# Patient Record
Sex: Female | Born: 1950 | Race: White | Hispanic: No | Marital: Married | Smoking: Never smoker
Health system: Southern US, Community
[De-identification: ages and names within clinical notes are randomized; demographics above are authoritative.]

## PROBLEM LIST (undated history)

## (undated) DIAGNOSIS — G43909 Migraine, unspecified, not intractable, without status migrainosus: Secondary | ICD-10-CM

## (undated) DIAGNOSIS — G44009 Cluster headache syndrome, unspecified, not intractable: Secondary | ICD-10-CM

## (undated) DIAGNOSIS — Z8619 Personal history of other infectious and parasitic diseases: Secondary | ICD-10-CM

## (undated) DIAGNOSIS — J309 Allergic rhinitis, unspecified: Secondary | ICD-10-CM

## (undated) HISTORY — DX: Migraine, unspecified, not intractable, without status migrainosus: G43.909

## (undated) HISTORY — DX: Personal history of other infectious and parasitic diseases: Z86.19

## (undated) HISTORY — DX: Allergic rhinitis, unspecified: J30.9

## (undated) HISTORY — DX: Cluster headache syndrome, unspecified, not intractable: G44.009

---
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
## 1997-11-15 ENCOUNTER — Ambulatory Visit (HOSPITAL_COMMUNITY): Admission: RE | Admit: 1997-11-15 | Discharge: 1997-11-15 | Payer: Self-pay | Admitting: Obstetrics and Gynecology

## 1998-12-07 ENCOUNTER — Other Ambulatory Visit: Admission: RE | Admit: 1998-12-07 | Discharge: 1998-12-07 | Payer: Self-pay | Admitting: Obstetrics and Gynecology

## 1999-01-05 ENCOUNTER — Ambulatory Visit (HOSPITAL_COMMUNITY): Admission: RE | Admit: 1999-01-05 | Discharge: 1999-01-05 | Payer: Self-pay | Admitting: Obstetrics and Gynecology

## 1999-04-26 ENCOUNTER — Other Ambulatory Visit: Admission: RE | Admit: 1999-04-26 | Discharge: 1999-04-26 | Payer: Self-pay | Admitting: Obstetrics and Gynecology

## 1999-04-26 ENCOUNTER — Encounter (INDEPENDENT_AMBULATORY_CARE_PROVIDER_SITE_OTHER): Payer: Self-pay | Admitting: Specialist

## 2000-03-26 ENCOUNTER — Other Ambulatory Visit: Admission: RE | Admit: 2000-03-26 | Discharge: 2000-03-26 | Payer: Self-pay | Admitting: Obstetrics and Gynecology

## 2000-05-13 ENCOUNTER — Encounter (INDEPENDENT_AMBULATORY_CARE_PROVIDER_SITE_OTHER): Payer: Self-pay | Admitting: Specialist

## 2000-05-13 ENCOUNTER — Other Ambulatory Visit: Admission: RE | Admit: 2000-05-13 | Discharge: 2000-05-13 | Payer: Self-pay | Admitting: Obstetrics and Gynecology

## 2001-08-06 ENCOUNTER — Other Ambulatory Visit: Admission: RE | Admit: 2001-08-06 | Discharge: 2001-08-06 | Payer: Self-pay | Admitting: Obstetrics and Gynecology

## 2001-08-18 ENCOUNTER — Encounter: Payer: Self-pay | Admitting: Obstetrics and Gynecology

## 2001-08-18 ENCOUNTER — Encounter: Admission: RE | Admit: 2001-08-18 | Discharge: 2001-08-18 | Payer: Self-pay | Admitting: Obstetrics and Gynecology

## 2001-10-08 ENCOUNTER — Encounter: Admission: RE | Admit: 2001-10-08 | Discharge: 2001-10-08 | Payer: Self-pay | Admitting: Obstetrics and Gynecology

## 2001-10-08 ENCOUNTER — Encounter: Payer: Self-pay | Admitting: Obstetrics and Gynecology

## 2002-08-10 ENCOUNTER — Other Ambulatory Visit: Admission: RE | Admit: 2002-08-10 | Discharge: 2002-08-10 | Payer: Self-pay | Admitting: Obstetrics and Gynecology

## 2003-09-06 ENCOUNTER — Other Ambulatory Visit: Admission: RE | Admit: 2003-09-06 | Discharge: 2003-09-06 | Payer: Self-pay | Admitting: Obstetrics and Gynecology

## 2004-09-19 ENCOUNTER — Other Ambulatory Visit: Admission: RE | Admit: 2004-09-19 | Discharge: 2004-09-19 | Payer: Self-pay | Admitting: Obstetrics and Gynecology

## 2005-01-11 ENCOUNTER — Ambulatory Visit (HOSPITAL_COMMUNITY): Admission: RE | Admit: 2005-01-11 | Discharge: 2005-01-11 | Payer: Self-pay | Admitting: Gastroenterology

## 2005-01-18 ENCOUNTER — Ambulatory Visit (HOSPITAL_COMMUNITY): Admission: RE | Admit: 2005-01-18 | Discharge: 2005-01-18 | Payer: Self-pay | Admitting: Gastroenterology

## 2005-01-18 ENCOUNTER — Encounter (INDEPENDENT_AMBULATORY_CARE_PROVIDER_SITE_OTHER): Payer: Self-pay | Admitting: Specialist

## 2005-09-25 ENCOUNTER — Other Ambulatory Visit: Admission: RE | Admit: 2005-09-25 | Discharge: 2005-09-25 | Payer: Self-pay | Admitting: Obstetrics and Gynecology

## 2007-01-13 ENCOUNTER — Ambulatory Visit (HOSPITAL_COMMUNITY): Admission: RE | Admit: 2007-01-13 | Discharge: 2007-01-13 | Payer: Self-pay | Admitting: Obstetrics and Gynecology

## 2008-05-13 ENCOUNTER — Other Ambulatory Visit: Admission: RE | Admit: 2008-05-13 | Discharge: 2008-05-13 | Payer: Self-pay | Admitting: Obstetrics and Gynecology

## 2009-05-16 ENCOUNTER — Other Ambulatory Visit: Admission: RE | Admit: 2009-05-16 | Discharge: 2009-05-16 | Payer: Self-pay | Admitting: Obstetrics and Gynecology

## 2010-12-17 LAB — HM MAMMOGRAPHY: HM Mammogram: NORMAL

## 2011-02-02 NOTE — Op Note (Signed)
NAMEAUBREI, Keith           ACCOUNT NO.:  [id number]   MEDICAL RECORD NO.:  [id number]          PATIENT TYPE:  AMB   LOCATION:  ENDO                         FACILITY:  [facility]   PHYSICIAN:  Bernette Redbird, M.D.   DATE OF BIRTH:  [birth year]   DATE OF PROCEDURE:  01/11/2005  DATE OF DISCHARGE:                                 OPERATIVE REPORT   PROCEDURE:  Colonoscopy.   INDICATIONS:  A 60 year old female for colon cancer screening. She has  chronic constipation and a history of nausea with sedation, but no worrisome  risk factors or symptoms. No prior screening exams.   FINDINGS:  Inadequate prep. No gross constricting lesions. The ulcerated  area in proximal colon.   PROCEDURE:  The nature, purpose, risks of the procedure have been discussed  with the patient, who provided written consent. Sedation was fentanyl 100  mcg and Versed 10 mg IV, without arrhythmias or desaturation. The Olympus  adjustable tension pediatric video colonoscope was used for this procedure.  The initial scope clogged due to poor prep and had to be exchanged with  another similar scope, which was advanced to the cecum as identified by  transillumination of the deep right lower quadrant and the absence of  further lumen.   However, the quality of prep on this exam was very poor and an adequate exam  was not able to be obtained. We suctioned out and irrigated a total of  approximately 2 liters of thick, brown fluid but despite this, the large  amount of residual particulate matter prevented adequate suctioning of the  residual stool and there were lakes of pea soup-consistency stool which  could not be suctioned up. Although no gross masses or constricting lesions  were present, I do not feel that small to moderate size lesions could be  excluded adequately by the current examination.   Note that in the proximal colon was an area of what appeared to be mucosal  ulceration about the size of a dime. There  was some slight nodularity  adjacent to it. I do not think this was scope trauma. There appeared to be a  rim of mucosa demarcating the ulcerated area from the normal colonic mucosa.  In attempting to better visualize this area, we shifted the position of the  patient but this basically submerged the area under a lake of thick brown  liquid stool, and we could never revisualize it despite repositioning the  patient, tried to suction etc.. It is conceivable that this lesion  represents some sort of ulcerated flap adenoma.   Pullback was then performed around the colon. The patient tolerated the  procedure well and there were no apparent immediate complications.   IMPRESSION:  1.  Screening colonoscopy with inadequate prep as described above (V7 6.51)  2.  Ulcerated area in the proximal colon of uncertain clinical significance.   PLAN:  We will arrange a repeat colonoscopy about a week from now, with a  more gradual but thorough prep.      RB/MEDQ  D:  01/11/2005  T:  01/11/2005  Job:  [id number]   cc:  Artist Pais, M.D.  [address], [address]  [place]  [place] [address]  Fax: [phone]

## 2011-02-02 NOTE — Op Note (Signed)
Alicia Keith, Alicia Keith           ACCOUNT NO.:  [id number]   MEDICAL RECORD NO.:  [id number]          PATIENT TYPE:  AMB   LOCATION:  ENDO                         FACILITY:  [facility]   PHYSICIAN:  Bernette Redbird, M.D.   DATE OF BIRTH:  August 06, 1951   DATE OF PROCEDURE:  01/18/2005  DATE OF DISCHARGE:                                 OPERATIVE REPORT   PROCEDURE:  Colonoscopy with biopsies.   INDICATION:  A 60 year old for colon cancer screening. Colonoscopy a week  ago was unsuccessful in visualizing the colon due to an inadequate prep but  at that time an ulcerated lesion was noted in the colon.   FINDINGS:  Solitary medium-sized ulceration in the mid colon without any  obvious mass effect.   PROCEDURE:  The nature, purpose and risks of the procedure had been  discussed with the patient who provided written consent. Sedation was  fentanyl 60 mcg, Versed 6 mg and Phenergan 12.5 mg IV without arrhythmias or  desaturation. The Olympus adjustable tension pediatric video colonoscope was  readily advanced to the cecum as identified by clear visualization of the  appendiceal orifice and pullback was then performed.   During pullback in roughly the midpoint of the colon, I encountered again a  1.5 cm fairly deep ulceration with normal-appearing surrounding mucosa and  no mass effect. Multiple biopsies (approximately nine or 10) were obtained  from the margin of this lesion. No other abnormalities were seen in the  colon. Retroflexion in the rectum was not successful due to a small rectal  ampulla. No polyps, cancer, diffuse colitis or vascular ectasia were noted.  The patient tolerated the procedure well and there no apparent  complications. It should be noted that the prep on this occasion was very  adequate.   IMPRESSION:  Solitary medium-sized colonic ulceration of unclear cause.   PLAN:  Await pathology results.      RB/MEDQ  D:  01/18/2005  T:  01/18/2005  Job:  [id number]   cc:    Artist Pais, M.D.  [address], [address]  [place]  [place] [address]  Fax: [phone]

## 2011-09-18 LAB — HM PAP SMEAR: HM Pap smear: NORMAL

## 2012-01-14 ENCOUNTER — Encounter: Payer: Self-pay | Admitting: Internal Medicine

## 2012-01-14 ENCOUNTER — Ambulatory Visit (INDEPENDENT_AMBULATORY_CARE_PROVIDER_SITE_OTHER): Payer: 59 | Admitting: Internal Medicine

## 2012-01-14 VITALS — BP 110/70 | HR 89 | Temp 98.2°F | Ht 61.5 in | Wt 115.0 lb

## 2012-01-14 DIAGNOSIS — Z23 Encounter for immunization: Secondary | ICD-10-CM

## 2012-01-14 DIAGNOSIS — G43909 Migraine, unspecified, not intractable, without status migrainosus: Secondary | ICD-10-CM

## 2012-01-14 DIAGNOSIS — L259 Unspecified contact dermatitis, unspecified cause: Secondary | ICD-10-CM

## 2012-01-14 DIAGNOSIS — Z2911 Encounter for prophylactic immunotherapy for respiratory syncytial virus (RSV): Secondary | ICD-10-CM

## 2012-01-14 DIAGNOSIS — J309 Allergic rhinitis, unspecified: Secondary | ICD-10-CM

## 2012-01-14 DIAGNOSIS — Z0000 Encounter for general adult medical examination without abnormal findings: Secondary | ICD-10-CM

## 2012-01-14 DIAGNOSIS — L309 Dermatitis, unspecified: Secondary | ICD-10-CM

## 2012-01-14 MED ORDER — TRIAMCINOLONE ACETONIDE 0.5 % EX OINT: TOPICAL_OINTMENT | Freq: Two times a day (BID) | CUTANEOUS | Status: AC

## 2012-01-14 MED ORDER — CETIRIZINE HCL 10 MG PO TABS: 10.0000 mg | ORAL_TABLET | Freq: Every day | ORAL | Status: DC

## 2012-01-14 MED ORDER — FLUTICASONE PROPIONATE 50 MCG/ACT NA SUSP: 2.0000 | Freq: Every day | NASAL | Status: DC

## 2012-01-14 NOTE — Progress Notes (Signed)
Subjective:    Patient ID: Alicia Keith, female    DOB: 04-Jun-1951, 61 y.o.   MRN: [id number]  HPI  New pt to me, here to establish care patient is here today for annual physical. Patient feels well and has no complaints. Reports recent labs done at [facility] in past few months including cholesterol and LFTs  Also reviewed chronic medical issues: allergic rhinitis - uncontrolled since 2012 - uses OTC meds prn - no sinus pain or fever, no discolored mucus discharge - ?other treatment options  Migraines, chronic - follows with neuro for same - on current med combo since 2000 - baseline without flares (4 severe HA episodes per month) - no change in nature of headache pattern  complains of itch intermittent on L breast - induced by tick bite? Onset > 1 year ago - temporary improved with OTC steroid cream but recurs every few months - no other skin problems or hx same  Past Medical History  Diagnosis Date  . History of chicken pox   . Headaches, cluster   . Migraine headache   . Allergic rhinitis, cause unspecified    Family History  Problem Relation Age of Onset  . Hyperlipidemia Other   . Hypertension Other    History  Substance Use Topics  . Smoking status: Never Smoker   . Smokeless tobacco: Not on file   Comment: married, retired PT  . Alcohol Use: Yes   Review of Systems Constitutional: Negative for fever or unexpected weight change.  Respiratory: Negative for cough and shortness of breath.   Cardiovascular: Negative for chest pain or palpitations.  Gastrointestinal: Negative for abdominal pain, no bowel changes.  Musculoskeletal: Negative for gait problem or joint swelling.  Skin: Negative for rash.  Neurological: Negative for dizziness or change in headache.  No other specific complaints in a complete review of systems (except as listed in HPI above).     Objective:   Physical Exam BP 110/70  Pulse 89  Temp(Src) 98.2 F (36.8 C) (Oral)  Ht 5' 1.5" (1.562  m)  Wt 115 lb (52.164 kg)  BMI 21.38 kg/m2  SpO2 97% Wt Readings from Last 3 Encounters:  01/14/12 115 lb (52.164 kg)   Constitutional: She is thin, fit and appears well-developed and well-nourished. No distress.  HENT: Head: Normocephalic and atraumatic. No sinus tenderness. Ears: B TMs ok, no erythema or effusion; Nose: clear rhinorrhea with pale turbinates, mild swelling. Mouth/Throat: Oropharynx is clear and moist. No oropharyngeal exudate. clear PND Eyes: Conjunctivae and EOM are normal. Pupils are equal, round, and reactive to light. No scleral icterus.  Neck: Normal range of motion. Neck supple. No JVD present. No thyromegaly present.  Cardiovascular: Normal rate, regular rhythm and normal heart sounds.  No murmur heard. No BLE edema. Pulmonary/Chest: Effort normal and breath sounds normal. No respiratory distress. She has no wheezes.  Abdominal: Soft. Bowel sounds are normal. She exhibits no distension. There is no tenderness. no masses Musculoskeletal: Normal range of motion, no joint effusions. No gross deformities Neurological: She is alert and oriented to person, place, and time. No cranial nerve deficit. Coordination normal.  Skin: mild red dermatitis with slight excoriation over L breast - remaining skin is warm and dry. No rash noted. No erythema.  Psychiatric: She has a normal mood and affect. Her behavior is normal. Judgment and thought content normal.   No results found for this basename: WBC, HGB, HCT, PLT, GLUCOSE, CHOL, TRIG, HDL, LDLDIRECT, LDLCALC, ALT, AST, NA, K, CL,  CREATININE, BUN, CO2, TSH, PSA, INR, GLUF, HGBA1C, MICROALBUR        Assessment & Plan:  CPX/v70.0 - Patient has been counseled on age-appropriate routine health concerns for screening and prevention. These are reviewed and up-to-date. Immunizations are up-to-date or declined. Labs recently done at [facility] - will send for copy of same and will be reviewed. Reports Colo 2007 - ?5 or 7 or 16yr follow  up - will send for records to help determine same, no apparent high risk history for frequent screening on hx   Mild dermatitis, periodic itch - suspect inflammatory reaction to prior insect bite at same location- use stronger steroid as needed - erx done

## 2012-01-14 NOTE — Assessment & Plan Note (Signed)
Chronic symptoms - currently at baseline with 4 severe episodes/month Follows with neuro ([facility]) at [facility] -  Will send for recent labs Med regimen reviewed, no changes recommended

## 2012-01-14 NOTE — Patient Instructions (Addendum)
It was good to see you today. we will send to your prior provider(s) for "release of records" as discussed today - labs from Dr Neale Burly and colonoscopy from Dr Matthias Hughs Usually colonoscopy is recommended every 10 years unless there are risk factors prompting more frequent screening - I will review Dr Buccini's notes and let you know when you are next due for colonoscopy screening Health Maintenance reviewed - Shingles vaccine today -all other recommended immunizations and age-appropriate screenings are up-to-date.  Add flonase spray daily to daily Zyrtec for allergy symptoms  Use Kenalog ointment for your skin itch as discussed Your prescription(s) have been submitted to your local pharmacy. Please take as directed and contact our office if you believe you are having problem(s) with the medication(s). Please schedule followup in 1 year for medical physical and labs, call sooner if problems.

## 2012-01-14 NOTE — Assessment & Plan Note (Signed)
Takes OTC meds as needed Will add flonase to supplement her OTC meds

## 2012-01-15 ENCOUNTER — Telehealth: Payer: Self-pay | Admitting: Internal Medicine

## 2012-01-15 NOTE — Telephone Encounter (Signed)
Received copies from [facility] ,on 01/15/12. Forwarded 15 pages to [employer] ,for review.

## 2012-01-25 ENCOUNTER — Telehealth: Payer: Self-pay | Admitting: Medical Oncology

## 2012-01-25 ENCOUNTER — Other Ambulatory Visit: Payer: Self-pay | Admitting: Medical Oncology

## 2012-01-25 NOTE — Telephone Encounter (Signed)
Received fax request for refill from [facility].  I called [facility] and pt called in for refill today , but rx expired. I called pt home phone and left message to return call.

## 2012-01-25 NOTE — Telephone Encounter (Signed)
Pt returned my call and told me to ignore refill request. Done.

## 2012-06-19 ENCOUNTER — Encounter: Payer: Self-pay | Admitting: Obstetrics and Gynecology

## 2012-06-19 NOTE — Progress Notes (Signed)
Quick Note:  Please send "Dense breast" letter to patient and document in chart when letter is sent. ______ 

## 2012-08-06 ENCOUNTER — Other Ambulatory Visit: Payer: Self-pay | Admitting: Internal Medicine

## 2013-01-12 ENCOUNTER — Other Ambulatory Visit (HOSPITAL_COMMUNITY): Payer: Self-pay | Admitting: Orthopedic Surgery

## 2013-01-12 DIAGNOSIS — M25562 Pain in left knee: Secondary | ICD-10-CM

## 2013-01-14 ENCOUNTER — Ambulatory Visit (HOSPITAL_COMMUNITY): Admission: RE | Admit: 2013-01-14 | Payer: 59 | Source: Ambulatory Visit

## 2013-01-14 ENCOUNTER — Ambulatory Visit (HOSPITAL_COMMUNITY)
Admission: RE | Admit: 2013-01-14 | Discharge: 2013-01-14 | Disposition: A | Discharge status: Home / Self Care | Payer: 59 | Source: Ambulatory Visit | Attending: Orthopedic Surgery | Admitting: Orthopedic Surgery

## 2013-01-14 DIAGNOSIS — M7120 Synovial cyst of popliteal space [Baker], unspecified knee: Secondary | ICD-10-CM | POA: Insufficient documentation

## 2013-01-14 DIAGNOSIS — M25569 Pain in unspecified knee: Secondary | ICD-10-CM | POA: Insufficient documentation

## 2013-02-02 ENCOUNTER — Other Ambulatory Visit: Payer: Self-pay | Admitting: Internal Medicine

## 2013-04-27 ENCOUNTER — Other Ambulatory Visit: Payer: Self-pay | Admitting: Internal Medicine

## 2013-08-06 ENCOUNTER — Other Ambulatory Visit: Payer: Self-pay | Admitting: Internal Medicine

## 2013-08-28 ENCOUNTER — Other Ambulatory Visit: Payer: Self-pay | Admitting: *Deleted

## 2013-08-28 MED ORDER — FLUTICASONE PROPIONATE 50 MCG/ACT NA SUSP: NASAL | Status: DC

## 2015-01-19 ENCOUNTER — Other Ambulatory Visit (HOSPITAL_COMMUNITY): Payer: Self-pay | Admitting: Gastroenterology

## 2015-02-21 ENCOUNTER — Encounter: Payer: Self-pay | Admitting: Internal Medicine

## 2015-03-07 ENCOUNTER — Other Ambulatory Visit: Payer: Self-pay | Admitting: Oncology

## 2015-03-07 MED ORDER — MOMETASONE FUROATE 0.1 % EX CREA: TOPICAL_CREAM | CUTANEOUS | Status: DC

## 2015-06-08 ENCOUNTER — Other Ambulatory Visit: Payer: Self-pay | Admitting: Oncology

## 2015-06-08 MED ORDER — LEVOFLOXACIN 250 MG PO TABS: 250.0000 mg | ORAL_TABLET | Freq: Every day | ORAL | Status: DC

## 2015-06-08 MED ORDER — MOMETASONE FUROATE 0.1 % EX CREA: TOPICAL_CREAM | CUTANEOUS | Status: DC

## 2015-10-12 DIAGNOSIS — Z01419 Encounter for gynecological examination (general) (routine) without abnormal findings: Secondary | ICD-10-CM | POA: Diagnosis not present

## 2015-10-12 DIAGNOSIS — N898 Other specified noninflammatory disorders of vagina: Secondary | ICD-10-CM | POA: Diagnosis not present

## 2015-10-12 DIAGNOSIS — M810 Age-related osteoporosis without current pathological fracture: Secondary | ICD-10-CM | POA: Diagnosis not present

## 2015-10-12 DIAGNOSIS — Z6822 Body mass index (BMI) 22.0-22.9, adult: Secondary | ICD-10-CM | POA: Diagnosis not present

## 2015-11-14 DIAGNOSIS — G43019 Migraine without aura, intractable, without status migrainosus: Secondary | ICD-10-CM | POA: Diagnosis not present

## 2015-11-14 DIAGNOSIS — R51 Headache: Secondary | ICD-10-CM | POA: Diagnosis not present

## 2015-11-14 DIAGNOSIS — Z79899 Other long term (current) drug therapy: Secondary | ICD-10-CM | POA: Diagnosis not present

## 2015-11-14 DIAGNOSIS — G43719 Chronic migraine without aura, intractable, without status migrainosus: Secondary | ICD-10-CM | POA: Diagnosis not present

## 2015-11-14 LAB — BASIC METABOLIC PANEL
BUN: 26 mg/dL — AB (ref 4–21)
Creatinine: 0.9 mg/dL (ref <=1.1)
GLUCOSE: 95 mg/dL
Potassium: 4.8 mmol/L (ref 3.4–5.3)
SODIUM: 139 mmol/L (ref 137–147)

## 2015-11-14 LAB — LIPID PANEL
Cholesterol: 255 mg/dL — AB (ref 0–200)
HDL: 91 mg/dL — AB (ref 35–70)
LDL CALC: 151 mg/dL
Triglycerides: 65 mg/dL (ref 40–160)

## 2015-11-14 LAB — HEPATIC FUNCTION PANEL
ALT: 20 U/L (ref 7–35)
AST: 24 U/L (ref 13–35)

## 2015-11-25 DIAGNOSIS — Z1231 Encounter for screening mammogram for malignant neoplasm of breast: Secondary | ICD-10-CM | POA: Diagnosis not present

## 2015-12-09 DIAGNOSIS — Z0100 Encounter for examination of eyes and vision without abnormal findings: Secondary | ICD-10-CM | POA: Diagnosis not present

## 2016-04-06 DIAGNOSIS — J069 Acute upper respiratory infection, unspecified: Secondary | ICD-10-CM | POA: Diagnosis not present

## 2016-04-09 ENCOUNTER — Telehealth: Payer: Self-pay | Admitting: Internal Medicine

## 2016-04-09 NOTE — Telephone Encounter (Signed)
Ok to  Accept  as new patient last Ov dr Felicity Coyer was 2013

## 2016-04-09 NOTE — Telephone Encounter (Signed)
Pt would like to know if you will accept her as a pt?  Pt's husband called to ask and is aware a note to be generated. Please advise if ok to schedule.

## 2016-04-23 NOTE — Progress Notes (Signed)
Pre visit review using our clinic review tool, if applicable. No additional management support is needed unless otherwise documented below in the visit note.  Chief Complaint  Patient presents with  . Establish Care     fu rx pna abnormal cxray    HPI: Patient  Alicia Keith  65 y.o. comes in today for New patient   visit  gegnerally well followed by Dr Neale Burly for  Migraines now quiescnet mostly  On lexapro , topamax and  Baclofen prn relpax a  seasonal allergy rx [facility] and otc as needed  Who  Had 2-3 weeks of couging and malaise on trip to [place] and  [place] to [facility] given z pack pred for 5 days tessalon perles and atrovent nasal    X ray showed poss opacity at right mid lung field ,  alsohyperinflation  cw copd .  Digital view was reviewed by dr Chestine Spore radiologist and felt CW PNA   And poss artifact vs nodule along rib.   Since her rx she has improved over 80 % still some cough . No fever  Appetite has been good . Needed fu as her PCP dr Felicity Coyer is no longer seeing patients.   Health Maintenance  Topic Date Due  . DEXA SCAN  05/18/2014  . MAMMOGRAM  06/18/2014  . TETANUS/TDAP  09/18/2015  . INFLUENZA VACCINE  04/17/2016  . Hepatitis C Screening  04/23/2017 (Originally 1950-11-02)  . PNA vac Low Risk Adult (2 of 2 - PPSV23) 04/24/2017  . COLONOSCOPY  01/18/2025  . ZOSTAVAX  Completed   Health Maintenance Review LIFESTYLE:  Exercise:   Yes walk  Tobacco/ETS: no but ets growing up Alcohol:   2 x per week Sugar beverages:n Sleep:8 hours Drug use: no HH of 2  G1P1 Last pap 2015 mammo 17 Td 2007? coloon 2016    ROS:  GEN/ HEENT: No fever, significant weight changes sweats headaches vision problems hearing changes, CV/ PULM; No chest pain shortness of breath , syncope,edema  change in exercise tolerance. GI /GU: No adominal pain, vomiting, change in bowel habits. No blood in the stool. No significant GU symptoms. SKIN/HEME: ,no acute skin rashes suspicious lesions or  bleeding. No lymphadenopathy, nodules, masses.  NEURO/ PSYCH:  No neurologic signs such as weakness numbness. No depression anxiety. IMM/ Allergy: No unusual infections.  Allergy .   REST of 12 system review negative except as per HPI   Past Medical History:  Diagnosis Date  . Allergic rhinitis, cause unspecified   . Headaches, cluster   . History of chicken pox   . Migraine headache     Past Surgical History:  Procedure Laterality Date  . TONSILLECTOMY  1955    Family History  Problem Relation Age of Onset  . Hyperlipidemia Mother   . Renal cancer Mother   . Hypertension Father   . Stroke Father     Social History   Social History  . Marital status: Married    Spouse name: N/A  . Number of children: N/A  . Years of education: N/A   Occupational History  . Retired    Social History Main Topics  . Smoking status: Never Smoker  . Smokeless tobacco: Never Used     Comment: married, retired PT  . Alcohol use Yes     Comment: "Couple glasses of wine"  . Drug use: No  . Sexual activity: Not Asked   Other Topics Concern  . None   Social History Narrative  8 hours of sleep per night   Lives with Dr. 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 baclofen (LIORESAL) 10 MG tablet Take 10 mg by mouth daily as needed.    Marland Kitchen escitalopram (LEXAPRO) 10 MG tablet Take 10 mg by mouth daily.    . fluticasone (FLONASE) 50 MCG/ACT nasal spray instill 2 sprays into each nostril once daily 16 g 0  . levofloxacin (LEVAQUIN) 250 MG tablet Take 1 tablet (250 mg total) by mouth daily. 14 tablet 1  . mometasone (ELOCON) 0.1 % cream Apply to affected area daily 15 g 4  . topiramate (TOPAMAX) 200 MG tablet Take 200 mg by mouth daily.    . cetirizine (ZYRTEC) 10 MG tablet Take 1 tablet (10 mg total) by mouth daily. 30 tablet 11  . eletriptan (RELPAX) 20 MG tablet One tablet by mouth at  onset of headache. May repeat in 2 hours if headache persists or recurs. may repeat in 2 hours if necessary     No facility-administered medications prior to visit.      EXAM:  BP 92/66   Pulse 80   Temp 97.6 F (36.4 C) (Oral)   Ht 5\' 1"  (1.549 m)   Wt 116 lb 6.4 oz (52.8 kg)   SpO2 97%   BMI 21.99 kg/m   Body mass index is 21.99 kg/m.  Physical Exam: Vital signs reviewed ocass dry upper airway sounding irritative cough mild hoarse no stridor ZOX:WRUE is a well-developed well-nourished alert cooperative    who appearsr stated age in no acute distress.  HEENT: normocephalic atraumatic , Eyes: PERRL EOM's full, conjunctiva clear, Nares: paten,t no deformity discharge or tenderness., min congestion and no face pain  Ears: no deformity EAC's clear TMs with normal landmarks. Mouth: clear OP, no lesions, edema.  Moist mucous membranes. Dentition in adequate repair. NECK: supple without masses, thyromegaly or bruits. CHEST/PULM:  Clear to auscultation and percussion breath sounds equal no wheeze , rales or rhonchi. No chest wall deformities or tenderness. CV: PMI is nondisplaced, S1 S2 no gallops, murmurs, rubs. Peripheral pulses are full without delay. .  ABDOMEN: Bowel sounds normal nontender  No guard or rebound, no hepato splenomegal  Extremtities:  No clubbing cyanosis or edema, no acute joint swelling or redness no focal atrophy NEURO:  Oriented x3, cranial nerves 3-12 appear to be intact, no obvious focal weakness,gait within normal limits  SKIN: No acute rashes normal turgor, color, no bruising or petechiae. PSYCH: Oriented, good eye contact, no obvious depression anxiety, cognition and judgment appear normal. LN: no cervical  adenopathy  No results found for: WBC, HGB, HCT, PLT, GLUCOSE, CHOL, TRIG, HDL, LDLDIRECT, LDLCALC, ALT, AST, NA, K, CL, CREATININE, BUN, CO2, TSH, PSA, INR, GLUF, HGBA1C, MICROALBUR  ASSESSMENT AND PLAN:  Discussed the following assessment and  plan:  Pneumonia involving right lung, unspecified part of lung - improved lcinically  - Plan: DG Chest 2 View  Abnormal chest x-ray - poss copd  right nodule vs artifact  plan fu x ray in 1-2 weeks plan pfts when well  Need for vaccination with 13-polyvalent pneumococcal conjugate vaccine - Plan: Pneumococcal conjugate vaccine 13-valent  Other seasonal allergic rhinitis - no hx of asthma HAs  Stable under specialty care.  Patient Care Team: Madelin Headings, MD as PCP - General (Internal Medicine) Santiago Glad, MD (Neurology) Silverio Lay,  MD (Obstetrics and Gynecology) Patient Instructions  Get x ray end of week     For repeat  At some point plan   PFTS to look at lung function .   Plan fu depending on results of  X ray and how you  Are doing .  Or  1 month or so .  If better can send in on my chart and we can order pfts  And then [employer]    Rosalind Guido K. Abdulla Pooley M.D.

## 2016-04-24 ENCOUNTER — Ambulatory Visit (INDEPENDENT_AMBULATORY_CARE_PROVIDER_SITE_OTHER): Payer: 59 | Admitting: Internal Medicine

## 2016-04-24 ENCOUNTER — Encounter: Payer: Self-pay | Admitting: Internal Medicine

## 2016-04-24 ENCOUNTER — Ambulatory Visit: Payer: 59 | Admitting: Internal Medicine

## 2016-04-24 VITALS — BP 92/66 | HR 80 | Temp 97.6°F | Ht 61.0 in | Wt 116.4 lb

## 2016-04-24 DIAGNOSIS — J302 Other seasonal allergic rhinitis: Secondary | ICD-10-CM | POA: Diagnosis not present

## 2016-04-24 DIAGNOSIS — R938 Abnormal findings on diagnostic imaging of other specified body structures: Secondary | ICD-10-CM | POA: Diagnosis not present

## 2016-04-24 DIAGNOSIS — R9389 Abnormal findings on diagnostic imaging of other specified body structures: Secondary | ICD-10-CM

## 2016-04-24 DIAGNOSIS — Z23 Encounter for immunization: Secondary | ICD-10-CM | POA: Diagnosis not present

## 2016-04-24 DIAGNOSIS — J189 Pneumonia, unspecified organism: Secondary | ICD-10-CM

## 2016-04-24 NOTE — Patient Instructions (Addendum)
Get x ray end of week     For repeat  At some point plan   PFTS to look at lung function .   Plan fu depending on results of  X ray and how you  Are doing .  Or  1 month or so .  If better can send in on my chart and we can order pfts  And then ROV

## 2016-04-27 ENCOUNTER — Ambulatory Visit (INDEPENDENT_AMBULATORY_CARE_PROVIDER_SITE_OTHER)
Admission: RE | Admit: 2016-04-27 | Discharge: 2016-04-27 | Disposition: A | Discharge status: Home / Self Care | Payer: 59 | Source: Ambulatory Visit | Attending: Internal Medicine | Admitting: Internal Medicine

## 2016-04-27 DIAGNOSIS — J189 Pneumonia, unspecified organism: Secondary | ICD-10-CM

## 2016-04-27 NOTE — Telephone Encounter (Signed)
Pt was scheduled

## 2016-05-03 ENCOUNTER — Encounter: Payer: Self-pay | Admitting: Family Medicine

## 2016-05-04 DIAGNOSIS — G43719 Chronic migraine without aura, intractable, without status migrainosus: Secondary | ICD-10-CM | POA: Diagnosis not present

## 2016-05-04 DIAGNOSIS — G43019 Migraine without aura, intractable, without status migrainosus: Secondary | ICD-10-CM | POA: Diagnosis not present

## 2016-05-10 ENCOUNTER — Other Ambulatory Visit: Payer: Self-pay

## 2016-06-05 ENCOUNTER — Telehealth: Payer: Self-pay | Admitting: Internal Medicine

## 2016-06-05 DIAGNOSIS — Z0100 Encounter for examination of eyes and vision without abnormal findings: Secondary | ICD-10-CM | POA: Diagnosis not present

## 2016-06-05 NOTE — Telephone Encounter (Signed)
Pt would like a call back.  Refused to elaborate.

## 2016-06-05 NOTE — Telephone Encounter (Signed)
OV to discuss     Need  copy of OV from ophthalmologist to review  If recurrent  Sx seek emergent care

## 2016-06-05 NOTE — Telephone Encounter (Signed)
Spoke to Alicia Keith.  Scheduled for 06/06/16 @ 12:30 PM.  She will stop by to pick up office note and bring with her.  Asked her to arrive at 12:15 PM.

## 2016-06-05 NOTE — Telephone Encounter (Signed)
Spoke to the pt.  She is experiencing vision in her rt eye.  She [facility] and he/she could not find anything wrong.  Was told it may have been a TIA.  Pt would like to know how to proceed.  She believes if TIA did occur than it was a month ago.  Please advise.

## 2016-06-06 ENCOUNTER — Encounter: Payer: Self-pay | Admitting: Internal Medicine

## 2016-06-06 ENCOUNTER — Ambulatory Visit (HOSPITAL_COMMUNITY)
Admission: RE | Admit: 2016-06-06 | Discharge: 2016-06-06 | Disposition: A | Discharge status: Home / Self Care | Payer: 59 | Source: Ambulatory Visit | Attending: Internal Medicine | Admitting: Internal Medicine

## 2016-06-06 ENCOUNTER — Ambulatory Visit (INDEPENDENT_AMBULATORY_CARE_PROVIDER_SITE_OTHER): Payer: 59 | Admitting: Internal Medicine

## 2016-06-06 ENCOUNTER — Other Ambulatory Visit: Payer: Self-pay | Admitting: Internal Medicine

## 2016-06-06 VITALS — BP 90/66 | Temp 98.5°F | Ht 61.0 in | Wt 115.1 lb

## 2016-06-06 DIAGNOSIS — H53121 Transient visual loss, right eye: Secondary | ICD-10-CM | POA: Diagnosis not present

## 2016-06-06 DIAGNOSIS — I700 Atherosclerosis of aorta: Secondary | ICD-10-CM

## 2016-06-06 DIAGNOSIS — I6523 Occlusion and stenosis of bilateral carotid arteries: Secondary | ICD-10-CM | POA: Insufficient documentation

## 2016-06-06 DIAGNOSIS — Z23 Encounter for immunization: Secondary | ICD-10-CM

## 2016-06-06 LAB — CBC WITH DIFFERENTIAL/PLATELET
Basophils Relative: 1.2 % (ref 0.0–3.0)
EOS PCT: 2.5 % (ref 0.0–5.0)
HCT: 37.2 % (ref 36.0–46.0)
HEMOGLOBIN: 12.5 g/dL (ref 12.0–15.0)
Lymphocytes Relative: 27.2 % (ref 12.0–46.0)
MCHC: 33.7 g/dL (ref 30.0–36.0)
MCV: 84.4 fl (ref 78.0–100.0)
Monocytes Relative: 8.1 % (ref 3.0–12.0)
Neutrophils Relative %: 61 % (ref 43.0–77.0)
Platelets: 108 10*3/uL — ABNORMAL LOW (ref 150.0–400.0)
RBC: 4.41 Mil/uL (ref 3.87–5.11)
RDW: 14.4 % (ref 11.5–15.5)

## 2016-06-06 LAB — C-REACTIVE PROTEIN: CRP: 0.2 mg/dL — AB (ref 0.5–20.0)

## 2016-06-06 LAB — BASIC METABOLIC PANEL
BUN: 25 mg/dL — AB (ref 6–23)
CHLORIDE: 103 meq/L (ref 96–112)
CO2: 31 mEq/L (ref 19–32)
Calcium: 8.9 mg/dL (ref 8.4–10.5)
Creatinine, Ser: 0.88 mg/dL (ref 0.40–1.20)
GFR: 68.11 mL/min (ref >60.00)
Glucose, Bld: 79 mg/dL (ref 70–99)
POTASSIUM: 4.4 meq/L (ref 3.5–5.1)
SODIUM: 139 meq/L (ref 135–145)

## 2016-06-06 LAB — SEDIMENTATION RATE: SED RATE: 16 mm/h (ref 0–30)

## 2016-06-06 NOTE — Progress Notes (Signed)
Pre visit review using our clinic review tool, if applicable. No additional management support is needed unless otherwise documented below in the visit note.  Chief Complaint  Patient presents with  . Loss of Vision    Rt eye    HPI: Alicia Keith 65 y.o.  sda appt  After seeing  opthal  On  Sept  19th 2017    And told could have had a tia ?  The scenario is that she was taking a walk in the woods in about 15-20 minutes into it noted some black spots in her right eye vision but eventually had a transient loss of the inferior aspect of your visual field only on the right. There was no other scotoma left eye was fine this lasted about 30 seconds. She had no associated headache vomiting arm leg symptoms cardiovascular symptoms. She walked back to her vehicle without difficulty and went home. She did take some aspirin on the advice of her husband. And got an appointment with her on a Dr. Nelle DonHollander. It took her a month to get in to be seen. She was seen yesterday was told she had a normal visual exam and probably had a TIA. She contacted us for further advice evaluation. Since this event she has had no other visual changes or neurologic symptoms. The only thing she can think of is when she got back to the house after her walk her right arm was shaking but not weak or numb. She's not had this problem before but has had intermittent frequent migraines. [facility]. She has a remote history of having had bilateral visual changes or ophthalmic migraine with a mild headache a while back. The symptoms were bilateral. She has no chest pain she thinks her reading is back to normal after recovering from her pneumonia. She denies any palpitations racing heart syncope. She takes Maxalt as needed for her migraines. ROS: See pertinent positives and negatives per HPI.  Past Medical History:  Diagnosis Date  . Allergic rhinitis, cause  unspecified   . Headaches, cluster   . History of chicken pox   . Migraine headache     Family History  Problem Relation Age of Onset  . Hyperlipidemia Mother   . Renal cancer Mother   . Hypertension Father   . Stroke Father     Social History   Social History  . Marital status: Married    Spouse name: N/A  . Number of children: N/A  . Years of education: N/A   Occupational History  . Retired    Social History Main Topics  . Smoking status: Never Smoker  . Smokeless tobacco: Never Used     Comment: married, retired PT  . Alcohol use Yes     Comment: "Couple glasses of wine"  . Drug use: No  . Sexual activity: Not Asked   Other Topics Concern  . None   Social History Narrative   8 hours of sleep per night   Lives with Dr. 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 baclofen (LIORESAL) 10 MG tablet Take 10 mg by mouth daily as needed.    Marland Kitchen. escitalopram (LEXAPRO) 10 MG tablet Take 10 mg by mouth daily.    . Estradiol (YUVAFEM) 10 MCG TABS vaginal tablet Place  vaginally. Using twice weekly    . fluticasone (FLONASE) 50 MCG/ACT nasal spray instill 2 sprays into each nostril once daily 16 g 0  . ibandronate (BONIVA) 150 MG tablet Take 150 mg by mouth every 30 (thirty) days. Take in the morning with a full glass of water, on an empty stomach, and do not take anything else by mouth or lie down for the next 30 min.    . mometasone (ELOCON) 0.1 % cream Apply to affected area daily 15 g 4  . rizatriptan (MAXALT) 10 MG tablet Take 10 mg by mouth as needed for migraine. May repeat in 2 hours if needed    . topiramate (TOPAMAX) 200 MG tablet Take 200 mg by mouth daily.    Marland Kitchen levofloxacin (LEVAQUIN) 250 MG tablet Take 1 tablet (250 mg total) by mouth daily. 14 tablet 1   No facility-administered medications prior to visit.      EXAM:  BP 90/66 (BP Location: Right Arm, Patient  Position: Sitting, Cuff Size: Normal)   Temp 98.5 F (36.9 C) (Oral)   Ht 5\' 1"  (1.549 m)   Wt 115 lb 1.6 oz (52.2 kg)   BMI 21.75 kg/m   Body mass index is 21.75 kg/m.  GENERAL: vitals reviewed and listed above, alert, oriented, appears well hydrated and in no acute distress HEENT: atraumatic, conjunctiva  clear, no obvious abnormalities on inspection of external nose and earseoms are normal  OP : no lesion edema or exudate tongue midline she has an occasional throat clearing cough. NECK: no obvious masses on inspection palpation no bruits are heard. LUNGS: clear to auscultation bilaterally, no wheezes, rales or rhonchi, good air movement CV: HRRR, no clubbing cyanosis or  peripheral edema nl cap refill  MS: moves all extremities without noticeable focal  Abnormality Abdomen soft no masses no bruits are heard. NEURO: oriented x 3 CN 3-12 appear intact. No focal muscle weakness or atrophy. DTRs symmetrical. Gait WNL.  Grossly non focal. No tremor or abnormal movement. PSYCH: pleasant and cooperative, no obvious depression or anxiety Lab Results  Component Value Date   WBC ----- 06/06/2016   HGB 12.5 06/06/2016   HCT 37.2 06/06/2016   PLT 108.0 (L) 06/06/2016   GLUCOSE 79 06/06/2016   CHOL 255 (A) 11/14/2015   TRIG 65 11/14/2015   HDL 91 (A) 11/14/2015   LDLCALC 151 11/14/2015   ALT 20 11/14/2015   AST 24 11/14/2015   NA 139 06/06/2016   K 4.4 06/06/2016   CL 103 06/06/2016   CREATININE 0.88 06/06/2016   BUN 25 (H) 06/06/2016   CO2 31 06/06/2016  FINDINGS:last x ray  The lungs are hyperinflated. The interstitial markings are coarse. There is no alveolar infiltrate or parenchymal masses. There is no pleural effusion or pneumothorax. The heart and pulmonary vascularity are normal. The mediastinum is normal in width. There is calcification in the wall of the aortic arch. The bony thorax is unremarkable.  IMPRESSION: COPD-reactive airway disease. No evidence of pneumonia,  CHF, nor other acute cardiopulmonary abnormality.  Aortic atherosclerosis.   Electronically Signed   By: David  [place] M.D.   On: 04/27/2016 14:08   ASSESSMENT AND PLAN:  Discussed the following assessment and plan:  Transient visual loss of right eye - inferior x 30 seconds  - Plan: EKG 12-Lead, CBC with Differential/Platelet, C-reactive protein, Sedimentation rate, Basic metabolic panel, CANCELED: US Carotid Bilateral  Atherosclerosis of aorta (HCC) - Plan: EKG 12-Lead, CANCELED: US Carotid Bilateral  Need for prophylactic  vaccination and inoculation against influenza - Plan: Flu vaccine HIGH DOSE PF (Fluzone High dose) This episode was 30 seconds a month ago with no residual and now normal eye exam. Lab evaluation today EKG normal sinus rhythm with RR prime in V1 probably normal cardiovascular exam appears to be normal. Begin with carotid duplex Dopplers would like to get her neurologist involved also. Incidental finding on her chest x-ray does show some atherosclerosis in the aorta and she should probably benefit from statin medication based on this matter what her Doppler showed. -Patient advised to return or notify health care team  if symptoms worsen ,persist or new concerns arise.  Patient Instructions  Will proceed with further evaluation  Vascular evaluation. Neur.  I usually have neurology also  Involved .  We may have dr Neale Burly opine  In regard to your sx and eval  In the interim please take asa every day .   You should be contacted about getting  Carotid  Artery evaluation.  Blood tests today .   Also incidental finding on you past x ray shoed some calcification in the wall of the aorta . Which may portend  risk .  We  Should considider adding a cholesterol medicine  But ok to do evaluation first.   If any recurrent sx  Seek emergent care right away.     Neta Mends. Nolita Kutter M.D.  Carotid Dopplers were normal with good flow. Contacted Dr. Onnie Boer office they  can see her as a follow-up next week to  Help decide on  she needs further neurologic referral evaluation.

## 2016-06-06 NOTE — Patient Instructions (Addendum)
Will proceed with further evaluation  Vascular evaluation. Neur.  I usually have neurology also  Involved .  We may have dr Neale BurlyFreeman opine  In regard to your sx and eval  In the interim please take asa every day .   You should be contacted about getting  Carotid  Artery evaluation.  Blood tests today .   Also incidental finding on you past x ray shoed some calcification in the wall of the aorta . Which may portend  risk .  We  Should considider adding a cholesterol medicine  But ok to do evaluation first.   If any recurrent sx  Seek emergent care right away.

## 2016-06-08 ENCOUNTER — Telehealth: Payer: Self-pay | Admitting: Internal Medicine

## 2016-06-08 NOTE — Telephone Encounter (Signed)
Spoke to the pt.  See result note. 

## 2016-06-08 NOTE — Telephone Encounter (Signed)
Pt is returning misty call °

## 2016-06-14 DIAGNOSIS — G43019 Migraine without aura, intractable, without status migrainosus: Secondary | ICD-10-CM | POA: Diagnosis not present

## 2016-06-14 DIAGNOSIS — G43719 Chronic migraine without aura, intractable, without status migrainosus: Secondary | ICD-10-CM | POA: Diagnosis not present

## 2016-06-15 ENCOUNTER — Other Ambulatory Visit: Payer: Self-pay | Admitting: Specialist

## 2016-06-15 DIAGNOSIS — R519 Headache, unspecified: Secondary | ICD-10-CM

## 2016-06-15 DIAGNOSIS — R51 Headache: Principal | ICD-10-CM

## 2016-06-26 ENCOUNTER — Ambulatory Visit
Admission: RE | Admit: 2016-06-26 | Discharge: 2016-06-26 | Disposition: A | Discharge status: Home / Self Care | Payer: 59 | Source: Ambulatory Visit | Attending: Specialist | Admitting: Specialist

## 2016-06-26 DIAGNOSIS — R51 Headache: Principal | ICD-10-CM

## 2016-06-26 DIAGNOSIS — H53121 Transient visual loss, right eye: Secondary | ICD-10-CM | POA: Diagnosis not present

## 2016-06-26 DIAGNOSIS — R519 Headache, unspecified: Secondary | ICD-10-CM

## 2016-10-31 DIAGNOSIS — G43019 Migraine without aura, intractable, without status migrainosus: Secondary | ICD-10-CM | POA: Diagnosis not present

## 2016-10-31 DIAGNOSIS — G43719 Chronic migraine without aura, intractable, without status migrainosus: Secondary | ICD-10-CM | POA: Diagnosis not present

## 2016-11-14 DIAGNOSIS — Z124 Encounter for screening for malignant neoplasm of cervix: Secondary | ICD-10-CM | POA: Diagnosis not present

## 2016-11-14 DIAGNOSIS — Z6822 Body mass index (BMI) 22.0-22.9, adult: Secondary | ICD-10-CM | POA: Diagnosis not present

## 2016-11-14 DIAGNOSIS — Z1231 Encounter for screening mammogram for malignant neoplasm of breast: Secondary | ICD-10-CM | POA: Diagnosis not present

## 2016-11-14 DIAGNOSIS — N898 Other specified noninflammatory disorders of vagina: Secondary | ICD-10-CM | POA: Diagnosis not present

## 2016-11-14 DIAGNOSIS — M810 Age-related osteoporosis without current pathological fracture: Secondary | ICD-10-CM | POA: Diagnosis not present

## 2016-11-14 DIAGNOSIS — Z01419 Encounter for gynecological examination (general) (routine) without abnormal findings: Secondary | ICD-10-CM | POA: Diagnosis not present

## 2016-11-26 DIAGNOSIS — Z1231 Encounter for screening mammogram for malignant neoplasm of breast: Secondary | ICD-10-CM | POA: Diagnosis not present

## 2016-11-26 LAB — HM MAMMOGRAPHY

## 2016-11-29 ENCOUNTER — Ambulatory Visit (INDEPENDENT_AMBULATORY_CARE_PROVIDER_SITE_OTHER): Payer: 59 | Admitting: Neurology

## 2016-11-29 ENCOUNTER — Encounter: Payer: Self-pay | Admitting: Neurology

## 2016-11-29 VITALS — BP 100/66 | HR 86 | Ht 60.5 in | Wt 118.5 lb

## 2016-11-29 DIAGNOSIS — G43711 Chronic migraine without aura, intractable, with status migrainosus: Secondary | ICD-10-CM

## 2016-11-29 MED ORDER — TOPIRAMATE 100 MG PO TABS: 100.0000 mg | ORAL_TABLET | Freq: Every day | ORAL | 3 refills | Status: DC

## 2016-11-29 MED ORDER — SERTRALINE HCL 25 MG PO TABS: 25.0000 mg | ORAL_TABLET | Freq: Every day | ORAL | 5 refills | Status: DC

## 2016-11-29 MED ORDER — ZONISAMIDE 25 MG PO CAPS: 25.0000 mg | ORAL_CAPSULE | Freq: Two times a day (BID) | ORAL | 0 refills | Status: DC

## 2016-11-29 NOTE — Patient Instructions (Signed)
Zonisamide capsules What is this medicine? ZONISAMIDE (zoe NIS a mide) is used to control partial seizures in adults with epilepsy. This medicine may be used for other purposes; ask your health care provider or pharmacist if you have questions. COMMON BRAND NAME(S): Zonegran What should I tell my health care provider before I take this medicine? They need to know if you have any of these conditions: -dehydrated -diarrhea -history of metabolic acidosis (too much acid in your blood) -ketogenic diet -kidney disease -liver disease -lung disease -osteoporosis -suicidal thoughts, plans, or attempt; a previous suicide attempt by you or a family member -an unusual or allergic reaction to zonisamide, sulfa drugs, other medicines, foods, dyes, or preservatives -pregnant or trying to get pregnant -breast-feeding How should I use this medicine? Take this medicine by mouth with a glass of water. Follow the directions on the prescription label. Swallow whole. Do not break open the capsule. This medicine may be taken with or without food. Take your doses at regular intervals. Do not take your medicine more often than directed. Do not stop taking this medicine unless instructed by your doctor or health care professional. A special MedGuide will be given to you by the pharmacist with each prescription and refill. Be sure to read this information carefully each time. Talk to your pediatrician regarding the use of this medicine in children. While this drug may be prescribed for children as young as 16 years of age for selected conditions, precautions do apply. Overdosage: If you think you have taken too much of this medicine contact a poison control center or emergency room at once. NOTE: This medicine is only for you. Do not share this medicine with others. What if I miss a dose? If you miss a dose, take it as soon as you can. If it is almost time for your next dose, take only that dose. Do not take double  or extra doses. What may interact with this medicine? This medicine may interact with the following medications -alcohol -antihistamines for allergy, cough and cold -antiviral medicines for HIV or AIDS -certain antibiotics like rifabutin, rifampin -certain medicines for anxiety or sleep -certain medicines for depression like amitriptyline, fluoxetine, sertraline -certain medicines for seizures like carbamazepine, phenobarbital, phenytoin, topiramate -digoxin -diuretics like acetazolamide, dichlorphenamide -general anesthetics like halothane, isoflurane, methoxyflurane, propofol -local anesthetics like lidocaine, pramoxine, tetracaine -medicines that relax muscles for surgery -narcotic medicines for pain -phenothiazines like chlorpromazine, mesoridazine, prochlorperazine, thioridazine -quinidine This list may not describe all possible interactions. Give your health care provider a list of all the medicines, herbs, non-prescription drugs, or dietary supplements you use. Also tell them if you smoke, drink alcohol, or use illegal drugs. Some items may interact with your medicine. What should I watch for while using this medicine? Visit your doctor or health care professional for regular checks on your progress. Wear a medical identification bracelet or chain to say you have epilepsy, and carry a card that lists all your medications. It is important to take this medicine exactly as directed. When first starting treatment, your dose will need to be adjusted slowly. It may take weeks or months before your dose is stable. You should contact your doctor or health care professional if your seizures get worse or if you have any new types of seizures. Do not stop taking except on your doctor's advice. You may develop a severe reaction. Your doctor will tell you how much medicine to take. You may get drowsy, dizzy, or have blurred vision. Do not   drive, use machinery, or do anything that needs mental  alertness until you know how this medicine affects you. To reduce dizzy or fainting spells, do not sit or stand up quickly, especially if you are an older patient. Alcohol can increase drowsiness and dizziness. Avoid alcoholic drinks. Avoid extreme heat. This medicine can cause you to sweat less than normal. Your body temperature could increase to dangerous levels, which may lead to heat stroke. This medicine may increase the chance of developing metabolic acidosis. If left untreated, this can cause kidney stones, bone disease, or slowed growth in children. Symptoms include breathing fast, fatigue, loss of appetite, irregular heartbeat, or loss of consciousness. Call your doctor immediately if you experience any of these side effects. Also, tell your doctor about any surgery you plan on having while taking this medicine since this may increase your risk for metabolic acidosis. This medicines may increase the risk of kidney stones. Drinking 6 to 8 glasses of water a day may help prevent the formation of kidney stones. The use of this medicine may increase the chance of suicidal thoughts or actions. Pay special attention to how you are responding while on this medicine. Any worsening of mood, or thoughts of suicide or dying should be reported to your health care professional right away. Women who become pregnant while using this medicine may enroll in the [place] Antiepileptic Drug Pregnancy Registry by calling 1-888-233-2334. This registry collects information about the safety of antiepileptic drug use during pregnancy. What side effects may I notice from receiving this medicine? Side effects that you should report to your doctor or health care professional as soon as possible: -allergic reactions like skin rash, itching or hives, swelling of the face, lips, or tongue -decreased sweating or a rise in body temperature, especially in patients under 17 years old -difficulty breathing or tightening of  the throat -feeling faint or lightheaded, falls -fever, sore throat, sores in your mouth, or bruising easily -hallucination, loss of contact with reality -irregular heartbeat -loss of appetite -redness, blistering, peeling or loosening of the skin, including inside the mouth -severe drowsiness, difficulty concentrating, or coordination problems -speech or language problems -sudden back pain, abdominal pain, pain when urinating, bloody or dark urine -suicidal thoughts or depression -unusual changes in behavior or mood -unusually weak or tired -vomiting Side effects that usually do not require medical attention (report to your doctor or health care professional if they continue or are bothersome): -headache -nausea This list may not describe all possible side effects. Call your doctor for medical advice about side effects. You may report side effects to FDA at 1-800-FDA-1088. Where should I keep my medicine? Keep out of reach of children. Store at room temperature between 15 and 30 degrees C (59 and 86 degrees F). Keep in a dry place protected from light. Throw away any unused medicine after the expiration date. NOTE: This sheet is a summary. It may not cover all possible information. If you have questions about this medicine, talk to your doctor, pharmacist, or health care provider.  2018 [employer]vier/Gold Standard (2015-10-06 09:50:49)  

## 2016-11-29 NOTE — Progress Notes (Signed)
Provider:  Melvyn Novas, M D  Referring Provider: Madelin Headings, MD Primary Care Physician:  Lorretta Harp, MD  Chief Complaint  Patient presents with  . Migraine    rm 10, New Pt, "hx since age 66, migraines run in my family"    HPI:  Alicia Keith is a 66 y.o. female , seen here as a referral/ revisit  from Dr. Fabian Sharp and Dr. Cyndie Chime, her husband.  Alicia Keith describes a almost lifelong history of migraines. She never experienced auras, she never experienced one-sided headaches and for this reason presumed that she did not have migraine headaches. Her headaches would classified originally as tension headaches and treated as such. It was after she got married that she was diagnosed with migraine headaches and Imitrex was used for therapy, with some success. The headaches begun as a teenager around age 58 and for related to her menstrual cycle. 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 This has included antidepressants, and antiepileptic. The latest medication treatment is by Lexapro and topiramate. She has tried to [employer] device- she feels some relief from that.  Most intense are her headache in the afternoon but she does frequently wake up with them. Headaches also wake her from sleep at night. These are pounding and felt strongest at the for head and the nape of the neck. She has seen Dr. Neale Burly by referral of Dr. Felicity Coyer the past, the headache was associated with nausea the time photophobia, phonophobia and the symptoms increased if she moved. Dizziness was often present or at least lightheadedness. She really rarely had to vomit. She does not report visual aura, olfactory aura. Triggers that she has identified a bright light and weather changes. Not every headache every day is a migraine Dr. Neale Burly also obtained a headache impact test she scored 63 points- over 60 is considered severe  impairment. I would like to list. Medications that have been tried and have failed, these have included amitriptyline, Cymbalta, visit for mean, doxepin, Effexor, imipramine, Lexapro, nortriptyline, Prozac and Vivactil. She has undergone procedural therapies including Botox, Nerve blocks and trigger point injections these have not helped at all. She has tried biofeedback without success, she attributes an increase in bruxism activity to Lexapro. She has had multiple MRIs over the years and apparently have never shown an abnormality. She has thus far not had any option to try CG RP medications. These are going to be introduced later this year.  Chief complaint according to patient : Headaches, migraines, daily photophobia and phonophobia. Bruxism.   Sleep habits are as follows: Her usual bedtime is around 10 PM, will take her up to an hour to go to sleep. She does not watch TV in bed. Her husband has rather restless sleep she reports. He snores, she does not.  The patient reports that she usually cannot sleep through the night and wakes up 4 maybe 5 times each night. She has resorted to listen to audiobooks and this way feels that it is easier to reenter sleep. He does not look at the clock when she wakes up at night, she usually stays in bed. She has at least one time nocturia at night. She rises usually at 8 AM, she is spontaneously awake at this time, after 6 or 7 hours of nocturnal sleep. She does not take daytime naps.  Medical history and family  history: The patient's mother also struggles with insomnia and frequent  sleep arousals and also listens to audiobooks. Her daughter seems not to have sleep problems. The patient's sister was involved in a motor vehicle accident which resulted in traumatic brain injury and intractable headaches of migrainous character. These were not present prior to the accident. Her mother has migraines as well. Maternal grandmother had migraines. Paternal uncle gets  migraines .   Social history: married, one daughter, retired Adult nursehysical therapist.  She likes to garden and to go to [employer].  The patient is not using tobacco products, she drinks occasionally a glass of wine on weekends, she drinks decaffeinated coffee. He does not drink sodas or iced tea.  Review of Systems: Out of a complete 14 system review, the patient complains of only the following symptoms, and all other reviewed systems are negative.   Social History   Social History  . Marital status: Married    Spouse name: N/A  . Number of children: 1  . Years of education: 6517   Occupational History  . Retired     PT   Social History Main Topics  . Smoking status: Never Smoker  . Smokeless tobacco: Never Used     Comment: married, retired PT  . Alcohol use Yes     Comment: "Couple glasses of wine"  . Drug use: No  . Sexual activity: Not on file   Other Topics Concern  . Not on file   Social History Narrative   8 hours of sleep per night   Lives with Dr. 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 Hyperlipidemia Mother   . Renal cancer Mother   . Hypertension Father   . Stroke Father     Past Medical History:  Diagnosis Date  . Allergic rhinitis, cause unspecified   . Headaches, cluster   . History of chicken pox   . Migraine headache     Past Surgical History:  Procedure Laterality Date  . TONSILLECTOMY  1955    Current Outpatient Prescriptions  Medication Sig Dispense Refill  . baclofen (LIORESAL) 10 MG tablet Take 10 mg by mouth daily as needed.    Marland Kitchen. escitalopram (LEXAPRO) 10 MG tablet Take 10 mg by mouth daily.    . Estradiol (YUVAFEM) 10 MCG TABS vaginal tablet Place vaginally. Using twice weekly    . fluticasone (FLONASE) 50 MCG/ACT nasal spray instill 2 sprays into each nostril once daily 16 g 0  . ibandronate (BONIVA) 150 MG tablet Take 150 mg by mouth every 30 (thirty)  days. Take in the morning with a full glass of water, on an empty stomach, and do not take anything else by mouth or lie down for the next 30 min.    . rizatriptan (MAXALT) 10 MG tablet Take 10 mg by mouth as needed for migraine. May repeat in 2 hours if needed    . topiramate (TOPAMAX) 200 MG tablet Take 200 mg by mouth daily.    . mometasone (ELOCON) 0.1 % cream Apply to affected area daily 15 g 4   No current facility-administered medications for this visit.     Allergies as of 11/29/2016 - Review Complete 11/29/2016  Allergen Reaction Noted  . Imipramine Rash 04/24/2016    Vitals: BP 100/66   Pulse 86   Ht 5' 0.5" (1.537 m)   Wt 118 lb 8 oz (53.8 kg)   BMI 22.76 kg/m  Last Weight:  Wt Readings from Last 1 Encounters:  11/29/16 118 lb 8 oz (53.8 kg)   ZOX:WRUE mass index is 22.76 kg/m.     Last Height:   Ht Readings from Last 1 Encounters:  11/29/16 5' 0.5" (1.537 m)    Physical exam:  General: The patient is awake, alert and appears not in acute distress. The patient is well groomed. Head: Normocephalic, atraumatic. Neck is supple. Mallampati 3,  neck circumference:13.0. Nasal airflow; congestion TMJ is evident. Retrognathia is seen.  Cardiovascular:  Regular rate and rhythm, without  murmurs or carotid bruit, and without distended neck veins. Respiratory: Lungs are clear to auscultation. Skin:  Without evidence of edema, or rash Trunk: BMI is 22.6 Neurologic exam : The patient is awake and alert, oriented to place and time.   Memory subjective described as intact. Attention span & concentration ability appears normal.  Speech is fluent, without dysarthria, dysphonia or aphasia.  Mood and affect are appropriate.  Cranial nerves: Pupils are equal and briskly reactive to light.  Funduscopic exam without  evidence of pallor or edema. Extraocular movements  in vertical and horizontal planes intact and without nystagmus. Visual fields by finger perimetry are  intact. Hearing to finger rub intact tinnitus .  Facial sensation intact to fine touch. Facial motor strength is symmetric and tongue and uvula move midline. Shoulder shrug was symmetrical.   Motor exam:   Normal tone, muscle bulk and symmetric strength in all extremities.  Sensory:  Fine touch, pinprick and vibration were tested in all extremities. Proprioception tested in the upper extremities was normal.  Coordination:. Finger-to-nose maneuver  normal without evidence of ataxia, dysmetria or tremor.  Gait and station: Patient walks without assistive device and is able unassisted to climb up to the exam table. Strength within normal limits.  Stance is stable and normal. Tandem gait is unfragmented. Turns with 3 Steps. Romberg testing is  negative.  Deep tendon reflexes: in the  upper and lower extremities are symmetric and intact. Babinski maneuver response is  downgoing.  The patient was advised of the nature of the diagnosed sleep disorder , the treatment options and risks for general a health and wellness arising from not treating the condition.  I spent more than  45  minutes of face to face time with the patient. Greater than 50% of time was spent in counseling and coordination of care. We have discussed the diagnosis and differential and I answered the patient's questions.     Assessment:  After physical and neurologic examination, review of laboratory studies,  Personal review of imaging studies, reports of other /same  Imaging studies ,  Results of polysomnography/ neurophysiology testing and pre-existing records as far as provided in visit., my assessment is   1) Alicia Keith suffered from a migrainous headaches for many years but the headache has also transformed in a chronic almost daily migraine. Traditional medications used to prevent or treat headaches have had less and less effect, and injection therapies nerve blocks Botox etc. have not had lasting effect at all. I think she  would be an excellent candidate for the new protein binder CGRP. It is my goal to obtain a treatment option for the patient, either as a participant in a pre-market trial or as soon as available commercially. I would love to try a preventive medication in the meantime.  She has been on topiramate and it 100 mg a day has felt some relief but not a complete alleviation of her  headaches she states that she has delayed for finding,  which is a well-known side effect of Topamax. She can continue using a triptan to abbreviate abort headaches.   Plan:  Treatment plan and additional workup : I like for her to try zonisamide which I have not found listed as a medication ever tried with her. Zonisamide at 25 mg bid for 30 days, than increase to 50 mg bid. Enrollment in CPRG trial or prescription.  No repeat MRI, Dr Neale Burly repeated one 07-2016 .  change Lexapro for Zoloft,   (Patient wants never to use Effexor again ) RV in early May.   Alicia Mylar Liliya Fullenwider MD  11/29/2016   CC: Madelin Headings, Md [address] [place], [place] [address]

## 2016-12-03 ENCOUNTER — Encounter: Payer: Self-pay | Admitting: Family Medicine

## 2016-12-04 ENCOUNTER — Telehealth: Payer: Self-pay | Admitting: Neurology

## 2016-12-04 ENCOUNTER — Encounter: Payer: Self-pay | Admitting: Neurology

## 2016-12-04 MED ORDER — ZONISAMIDE 25 MG PO CAPS: 25.0000 mg | ORAL_CAPSULE | Freq: Two times a day (BID) | ORAL | 0 refills | Status: DC

## 2016-12-04 NOTE — Telephone Encounter (Signed)
Caylea(Technician) called asking if the quanity of zonisamide (ZONEGRAN) 25 MG capsule can be increased to 90 since it is mail order, a direct # to call back to a pharmacist is [phone]

## 2016-12-04 NOTE — Addendum Note (Signed)
Addended by: Geronimo RunningINKINS, Rasha Ibe A on: 12/04/2016 10:58 AM   Modules accepted: Orders

## 2016-12-04 NOTE — Telephone Encounter (Signed)
  The new class of medications that I have discussed with Mrs. Alicia Keith in her visit will come to market in late May 2018.  I left a voicemail stating that I would like to see her at the time of this new medication coming to market so that we can try it as soon as it is available.CD

## 2016-12-04 NOTE — Telephone Encounter (Signed)
I spoke to Dr. Vickey Hugerohmeier. She gave me a verbal order to change the zonegran RX to a 90 day supply.

## 2017-01-22 ENCOUNTER — Ambulatory Visit: Payer: 59 | Admitting: Neurology

## 2017-03-12 ENCOUNTER — Encounter: Payer: Self-pay | Admitting: Neurology

## 2017-03-12 ENCOUNTER — Ambulatory Visit (INDEPENDENT_AMBULATORY_CARE_PROVIDER_SITE_OTHER): Payer: 59 | Admitting: Neurology

## 2017-03-12 ENCOUNTER — Telehealth: Payer: Self-pay | Admitting: Neurology

## 2017-03-12 VITALS — BP 110/71 | HR 80 | Ht 60.5 in | Wt 120.0 lb

## 2017-03-12 DIAGNOSIS — G43011 Migraine without aura, intractable, with status migrainosus: Secondary | ICD-10-CM | POA: Diagnosis not present

## 2017-03-12 MED ORDER — SERTRALINE HCL 25 MG PO TABS: 25.0000 mg | ORAL_TABLET | Freq: Every day | ORAL | 5 refills | Status: DC

## 2017-03-12 MED ORDER — BACLOFEN 10 MG PO TABS: 10.0000 mg | ORAL_TABLET | Freq: Every day | ORAL | 5 refills | Status: DC | PRN

## 2017-03-12 MED ORDER — ZONISAMIDE 25 MG PO CAPS: 25.0000 mg | ORAL_CAPSULE | Freq: Two times a day (BID) | ORAL | 0 refills | Status: DC

## 2017-03-12 MED ORDER — RIZATRIPTAN BENZOATE 10 MG PO TABS: 10.0000 mg | ORAL_TABLET | ORAL | 5 refills | Status: DC | PRN

## 2017-03-12 MED ORDER — ERENUMAB-AOOE 70 MG/ML ~~LOC~~ SOAJ: 140.0000 mg | SUBCUTANEOUS | 2 refills | Status: DC

## 2017-03-12 NOTE — Telephone Encounter (Signed)
Patient requests Rx refills for baclofen (LIORESAL) 10 MG tablet and sertraline (ZOLOFT) 25 MG tablet be sent to [facility]. [facility].

## 2017-03-12 NOTE — Addendum Note (Signed)
Addended by: Melvyn NovasHMEIER, Peretz Thieme on: 03/12/2017 01:43 PM   Modules accepted: Orders

## 2017-03-12 NOTE — Patient Instructions (Signed)

## 2017-03-12 NOTE — Addendum Note (Signed)
Addended by: Geronimo RunningINKINS, Dakayla Disanti A on: 03/12/2017 03:04 PM   Modules accepted: Orders

## 2017-03-12 NOTE — Telephone Encounter (Signed)
Baclofen and zoloft sent to [facility].

## 2017-03-12 NOTE — Progress Notes (Addendum)
Provider:  Melvyn Novas, M D  Referring Provider: Madelin Headings, MD Primary Care Physician:  Madelin Headings, MD  Chief Complaint  Patient presents with  . Migraine    She is still having daily headaches.  She stopped Zonegran because it was unhelpful.  . Depression    She is only taking sertraline 12.5mg  daily.  She felt the medication was causing her to grit her teeth.    HPI:  Alicia Keith is a 66 y.o. female , seen here as a referral/ revisit  from Dr. Fabian Sharp and Dr. Cyndie Chime, her husband.  Interval history from 03/12/2017. Alicia Keith confirms that her migraine pattern has not changed, neither in frequency nor intensity. We are talking today about the enrollment into a monoclonal antibody therapy for migraine treatment, the drug is called AIMOVIG Dorise Hiss). Once a month. Leave the injector out of the refrigeration for 30 minutes and use subcutaneously in the thigh or abdomen, once monthly 70 mg or  140 mg in 2 ml.  She can get 4 pens in the trial. Nurse Victorino Dike has instructed her.       Alicia Keith describes a almost lifelong history of migraines. She never experienced auras, she never experienced one-sided headaches and for this reason presumed that she did not have migraine headaches. Her headaches would classified originally as tension headaches and treated as such. It was after she got married that she was diagnosed with migraine headaches and Imitrex was used for therapy, with some success. The headaches begun as a teenager around age 89 and for related to her menstrual cycle. 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 This has included antidepressants, and antiepileptic. The latest medication treatment is by Lexapro and topiramate. She has tried to [employer] device- she feels some relief from that.  Most intense are her headache in the afternoon but she does  frequently wake up with them. Headaches also wake her from sleep at night. These are pounding and felt strongest at the for head and the nape of the neck. She has seen Dr. Neale Burly by referral of Dr. Felicity Coyer the past, the headache was associated with nausea the time photophobia, phonophobia and the symptoms increased if she moved. Dizziness was often present or at least lightheadedness. She really rarely had to vomit. She does not report visual aura, olfactory aura. Triggers that she has identified a bright light and weather changes. Not every headache every day is a migraine Dr. Neale Burly also obtained a headache impact test she scored 63 points- over 60 is considered severe impairment. I would like to list. Medications that have been tried and have failed, these have included amitriptyline, Cymbalta, visit for mean, doxepin, Effexor, imipramine, Lexapro, nortriptyline, Prozac and Vivactil. She has undergone procedural therapies including Botox, Nerve blocks and trigger point injections these have not helped at all. She has tried biofeedback without success, she attributes an increase in bruxism activity to Lexapro. She has had multiple MRIs over the years and apparently have never shown an abnormality. She has thus far not had any option to try CG RP medications. These are going to be introduced later this year.  Chief complaint according to patient : Headaches, migraines, daily photophobia and phonophobia. Bruxism. Sleep habits are as follows: Her usual bedtime is around 10 PM, will take her up to an hour to go to sleep. She does not watch TV in bed. Her husband  has rather restless sleep she reports. He snores, she does not. The patient reports that she usually cannot sleep through the night and wakes up 4 maybe 5 times each night. She has resorted to listen to audiobooks and this way feels that it is easier to reenter sleep. He does not look at the clock when she wakes up at night, she usually stays in bed.  She has at least one time nocturia at night. She rises usually at 8 AM, she is spontaneously awake at this time, after 6 or 7 hours of nocturnal sleep. She does not take daytime naps.  Medical history and family  history: The patient's mother also struggles with insomnia and frequent sleep arousals and also listens to audiobooks. Her daughter seems not to have sleep problems. The patient's sister was involved in a motor vehicle accident which resulted in traumatic brain injury and intractable headaches of migrainous character. These were not present prior to the accident. Her mother has migraines as well. Maternal grandmother had migraines. Paternal uncle gets migraines .   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  She likes to garden and to go to [employer].  The patient is not using tobacco products, she drinks occasionally a glass of wine on weekends, she drinks decaffeinated coffee. He does not drink sodas or iced tea.  Review of Systems: Out of a complete 14 system review, the patient complains of only the following symptoms, and all other reviewed systems are negative.   Social History   Social History  . Marital status: Married    Spouse name: N/A  . Number of children: 1  . Years of education: 54   Occupational History  . Retired     PT   Social History Main Topics  . Smoking status: Never Smoker  . Smokeless tobacco: Never Used     Comment: married, retired PT  . Alcohol use Yes     Comment: "Couple glasses of wine"  . Drug use: No  . Sexual activity: Not on file   Other Topics Concern  . Not on file   Social History Narrative   8 hours of sleep per night   Lives with Dr. 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 Hyperlipidemia Mother   . Renal cancer Mother   . Hypertension Father   . Stroke Father     Past Medical History:  Diagnosis Date  . Allergic  rhinitis, cause unspecified   . Headaches, cluster   . History of chicken pox   . Migraine headache     Past Surgical History:  Procedure Laterality Date  . TONSILLECTOMY  1955    Current Outpatient Prescriptions  Medication Sig Dispense Refill  . baclofen (LIORESAL) 10 MG tablet Take 10 mg by mouth daily as needed.    . Estradiol (YUVAFEM) 10 MCG TABS vaginal tablet Place vaginally. Using twice weekly    . fluticasone (FLONASE) 50 MCG/ACT nasal spray instill 2 sprays into each nostril once daily 16 g 0  . ibandronate (BONIVA) 150 MG tablet Take 150 mg by mouth every 30 (thirty) days. Take in the morning with a full glass of water, on an empty stomach, and do not take anything else by mouth or lie down for the next 30 min.    . rizatriptan (MAXALT) 10 MG tablet Take 10 mg by mouth as  needed for migraine. May repeat in 2 hours if needed    . sertraline (ZOLOFT) 25 MG tablet Take 1 tablet (25 mg total) by mouth daily. 30 tablet 5  . topiramate (TOPAMAX) 100 MG tablet Take 1 tablet (100 mg total) by mouth daily. 90 tablet 3  . zonisamide (ZONEGRAN) 25 MG capsule Take 1 capsule (25 mg total) by mouth 2 (two) times daily. 180 capsule 0  . mometasone (ELOCON) 0.1 % cream Apply to affected area daily 15 g 4   No current facility-administered medications for this visit.     Allergies as of 03/12/2017 - Review Complete 03/12/2017  Allergen Reaction Noted  . Imipramine Rash 04/24/2016    Vitals: BP 110/71   Pulse 80   Ht 5' 0.5" (1.537 m)   Wt 120 lb (54.4 kg)   BMI 23.05 kg/m  Last Weight:  Wt Readings from Last 1 Encounters:  03/12/17 120 lb (54.4 kg)   ZOX:WRUEBMI:Body mass index is 23.05 kg/m.     Last Height:   Ht Readings from Last 1 Encounters:  03/12/17 5' 0.5" (1.537 m)    Physical exam:  General: The patient is awake, alert and appears not in acute distress. The patient is well groomed. Head: Normocephalic, atraumatic. Neck is supple. Mallampati 3,  neck circumference:13.0.  Nasal airflow; congestion TMJ is evident. Retrognathia is seen.  Cardiovascular:  Regular rate and rhythm, without  murmurs or carotid bruit, and without distended neck veins. Respiratory: Lungs are clear to auscultation. Skin:  Without evidence of edema, or rash Trunk: BMI is 22.6 Neurologic exam : The patient is awake and alert, oriented to place and time.   Memory subjective described as intact. Attention span & concentration ability appears normal.  Speech is fluent, without dysarthria, dysphonia or aphasia.  Mood and affect are appropriate.  Cranial nerves: Pupils are equal and briskly reactive to light.  Funduscopic exam without  evidence of pallor or edema. Extraocular movements  in vertical and horizontal planes intact and without nystagmus. Visual fields by finger perimetry are intact. Hearing to finger rub intact tinnitus .  Facial sensation intact to fine touch. Facial motor strength is symmetric and tongue and uvula move midline. Shoulder shrug was symmetrical.   I spent more than  15  minutes of face to face time with the patient.  This was dedicated to introduce AIMOVIG migraine prevention. Greater than 50% of time was spent in counseling and coordination of care. We have discussed the diagnosis and differential and I answered the patient's questions.     Assessment:  After physical and neurologic examination, review of laboratory studies,  Personal review of imaging studies, reports of other /same  Imaging studies ,  Results of polysomnography/ neurophysiology testing and pre-existing records as far as provided in visit., my assessment is   1) Alicia Keith suffered from a migrainous headaches for many years but the headache has also transformed in a chronic almost daily migraine. Traditional medications used to prevent or treat headaches have had less and less effect, and injection therapies nerve blocks Botox etc. have not had lasting effect at all. I think she would be an  excellent candidate for the new protein binder CGRP. She can continue using a triptan to abbreviate abort headaches.  Zonisamide for preventive.     Plan:  Treatment plan and additional workup : I like for her to try zonisamide which I have not found listed as a medication ever tried with her. Zonisamide at 25 mg  bid for 30 days, than increase to 50 mg bid. Enrollment in CPRG trial or prescription.  No repeat MRI, Dr Neale Burly repeated one 07-2016 .  change Lexapro for Zoloft,   (Patient wants never to use Effexor again ) RV in early October.   Porfirio Mylar Tilly Pernice MD  03/12/2017   CC: Madelin Headings, Md [address] [place], [place] [address]

## 2017-03-13 LAB — COMPREHENSIVE METABOLIC PANEL
A/G RATIO: 1.7 (ref 1.2–2.2)
ALBUMIN: 4.2 g/dL (ref 3.6–4.8)
ALT: 21 IU/L (ref 0–32)
AST: 21 IU/L (ref 0–40)
Alkaline Phosphatase: 52 IU/L (ref 39–117)
BUN / CREAT RATIO: 23 (ref 12–28)
BUN: 20 mg/dL (ref 8–27)
CHLORIDE: 104 mmol/L (ref 96–106)
CO2: 24 mmol/L (ref 20–29)
Calcium: 9.5 mg/dL (ref 8.7–10.3)
Creatinine, Ser: 0.88 mg/dL (ref 0.57–1.00)
GFR calc non Af Amer: 68 mL/min/{1.73_m2} (ref >59)
GFR, EST AFRICAN AMERICAN: 79 mL/min/{1.73_m2} (ref >59)
GLOBULIN, TOTAL: 2.5 g/dL (ref 1.5–4.5)
Glucose: 95 mg/dL (ref 65–99)
POTASSIUM: 4.8 mmol/L (ref 3.5–5.2)
SODIUM: 142 mmol/L (ref 134–144)
TOTAL PROTEIN: 6.7 g/dL (ref 6.0–8.5)

## 2017-03-15 ENCOUNTER — Telehealth: Payer: Self-pay | Admitting: *Deleted

## 2017-03-15 NOTE — Telephone Encounter (Signed)
LVM informing patient she had a completely normal metabolic panel, normal labs. Advised her she can start the new medication Dr Vickey Hugerohmeier ordered for her. Advised her the office is now closed, but left number for any questions. Stated she can call next week as needed.

## 2017-03-26 NOTE — Telephone Encounter (Signed)
Patient called office in reference to starting Aimovig.  She would like to know how she receives the medication.  Please call

## 2017-03-26 NOTE — Telephone Encounter (Signed)
Patient is aware to expect a call from Aimovig and that the medication will be shipped to her home.

## 2017-06-07 ENCOUNTER — Encounter: Payer: Self-pay | Admitting: Internal Medicine

## 2017-06-25 ENCOUNTER — Telehealth: Payer: Self-pay | Admitting: Neurology

## 2017-06-25 NOTE — Telephone Encounter (Signed)
Called the patient to talk about Aimovig. Pt has received her two doses and states that they didn't work for her. Her last dose was sept 13. 2018. I was informing the patient that we would have to see about getting her set up with her local pharmacy but the patient states that since it didn't work for her she did not want to continue it. Pt has an apt next week to discuss what to try next.

## 2017-07-01 DIAGNOSIS — Z0100 Encounter for examination of eyes and vision without abnormal findings: Secondary | ICD-10-CM | POA: Diagnosis not present

## 2017-07-03 ENCOUNTER — Ambulatory Visit (INDEPENDENT_AMBULATORY_CARE_PROVIDER_SITE_OTHER): Payer: 59 | Admitting: Neurology

## 2017-07-03 ENCOUNTER — Encounter: Payer: Self-pay | Admitting: Neurology

## 2017-07-03 VITALS — BP 108/75 | HR 72 | Ht 60.0 in | Wt 118.0 lb

## 2017-07-03 DIAGNOSIS — G43711 Chronic migraine without aura, intractable, with status migrainosus: Secondary | ICD-10-CM

## 2017-07-03 MED ORDER — BACLOFEN 10 MG PO TABS: 10.0000 mg | ORAL_TABLET | Freq: Every day | ORAL | 1 refills | Status: DC | PRN

## 2017-07-03 MED ORDER — SERTRALINE HCL 25 MG PO TABS: 25.0000 mg | ORAL_TABLET | Freq: Every day | ORAL | 3 refills | Status: DC

## 2017-07-03 MED ORDER — RIZATRIPTAN BENZOATE 10 MG PO TABS: 10.0000 mg | ORAL_TABLET | ORAL | 5 refills | Status: DC | PRN

## 2017-07-03 NOTE — Progress Notes (Signed)
Provider:  Melvyn Novas, M D  Referring Provider: Madelin Headings, MD Primary Care Physician:  Alicia Headings, MD  Chief Complaint  Patient presents with  . Follow-up    pt alone, rm 11, pt states she took the Aimovig 70mg  and saw no difference with her headaches. pt states she struggles almost daily with her headaches    HPI:  Alicia Keith is a 66 y.o. female, seen here as a referral/ revisit  from Alicia Keith and Alicia Keith, her husband.  Interval history from 07/03/2017. Alicia Keith tuna has been able to use AIMOVIG at 70 mg twice or what interval of 6 weeks. She has not seen a reduction in migraine frequency, intensity, and no change in migrainous character. We are discussing today to either switch to another CR BG medication or to give 140 mg dose a trial. I opted to first try the 140 mg dosing, and if this does not give her success with in the next 3 weeks I will change her to use the TEVA CRBG.  She agrees with this plan. She plans a journey to [place] and we will meet after her travels. She continues to avoid wine and chocolate.     Interval history from 03/12/2017. Alicia Keith confirms that her migraine pattern has not changed, neither in frequency nor intensity. We are talking today about the enrollment into a monoclonal antibody therapy for migraine treatment, the drug is called AIMOVIG Dorise Hiss). Once a month. Leave the injector out of the refrigeration for 30 minutes and use subcutaneously in the thigh or abdomen, once monthly 70 mg or  140 mg in 2 ml.  She can get 4 pens in the trial. Nurse Alicia Keith has instructed her.    Alicia Keith describes a almost lifelong history of migraines. She never experienced auras, she never experienced one-sided headaches and for this reason presumed that she did not have migraine headaches. Her headaches would classified originally as tension headaches and treated as such. It was after she got married  that she was diagnosed with migraine headaches and Imitrex was used for therapy, with some success. The headaches begun as a teenager around age 47 and for related to her menstrual cycle. 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 This has included antidepressants, and antiepileptic. The latest medication treatment is by Lexapro and topiramate. She has tried to [employer] device- she feels some relief from that.  Most intense are her headache in the afternoon but she does frequently wake up with them. Headaches also wake her from sleep at night. These are pounding and felt strongest at the for head and the nape of the neck. She has seen Alicia Keith by referral of Alicia Keith the past, the headache was associated with nausea the time photophobia, phonophobia and the symptoms increased if she moved. Dizziness was often present or at least lightheadedness. She really rarely had to vomit. She does not report visual aura, olfactory aura. Triggers that she has identified a bright light and weather changes. Not every headache every day is a migraine Alicia Keith also obtained a headache impact test she scored 63 points- over 60 is considered severe impairment. I would like to list. Medications that have been tried and have failed, these have included amitriptyline, Cymbalta, visit for mean, doxepin, Effexor, imipramine, Lexapro, nortriptyline, Prozac and Vivactil. She has undergone procedural therapies including Botox, Nerve blocks and trigger point  injections these have not helped at all. She has tried biofeedback without success, she attributes an increase in bruxism activity to Lexapro. She has had multiple MRIs over the years and apparently have never shown an abnormality. She has thus far not had any option to try CG RP medications. These are going to be introduced later this year.  Chief complaint according to patient : Headaches, migraines,  daily photophobia and phonophobia. Bruxism. Sleep habits are as follows: Her usual bedtime is around 10 PM, will take her up to an hour to go to sleep. She does not watch TV in bed. Her husband has rather restless sleep she reports. He snores, she does not. The patient reports that she usually cannot sleep through the night and wakes up 4 maybe 5 times each night. She has resorted to listen to audiobooks and this way feels that it is easier to reenter sleep. He does not look at the clock when she wakes up at night, she usually stays in bed. She has at least one time nocturia at night. She rises usually at 8 AM, she is spontaneously awake at this time, after 6 or 7 hours of nocturnal sleep. She does not take daytime naps.  Medical history and family  history: The patient's mother also struggles with insomnia and frequent sleep arousals and also listens to audiobooks. Her daughter seems not to have sleep problems. The patient's sister was involved in a motor vehicle accident which resulted in traumatic brain injury and intractable headaches of migrainous character. These were not present prior to the accident. Her mother has migraines as well. Maternal grandmother had migraines. Paternal uncle gets migraines .   Social history: married, one daughter, retired Adult nursehysical therapist.  She likes to garden and to go to [employer].  The patient is not using tobacco products, she drinks occasionally a glass of wine on weekends, she drinks decaffeinated coffee. He does not drink sodas or iced tea.  Review of Systems: Out of a complete 14 system review, the patient complains of only the following symptoms, and all other reviewed systems are negative.   Social History   Social History  . Marital status: Married    Spouse name: N/A  . Number of children: 1  . Years of education: 2317   Occupational History  . Retired     PT   Social History Main Topics  . Smoking status: Never Smoker  . Smokeless tobacco:  Never Used     Comment: married, retired PT  . Alcohol use Yes     Comment: "Couple glasses of wine"  . Drug use: No  . Sexual activity: Not on file   Other Topics Concern  . Not on file   Social History Narrative   8 hours of sleep per night   Lives with Dr. 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 Hyperlipidemia Mother   . Renal cancer Mother   . Hypertension Father   . Stroke Father     Past Medical History:  Diagnosis Date  . Allergic rhinitis, cause unspecified   . Headaches, cluster   . History of chicken pox   . Migraine headache     Past Surgical History:  Procedure Laterality Date  . TONSILLECTOMY  1955    Current Outpatient Prescriptions  Medication Sig Dispense Refill  . baclofen (LIORESAL) 10 MG tablet Take  1 tablet (10 mg total) by mouth daily as needed. 30 each 5  . Erenumab-aooe 70 MG/ML SOAJ Inject 140 mg into the skin every 30 (thirty) days. 4 pen 2  . Estradiol (YUVAFEM) 10 MCG TABS vaginal tablet Place vaginally. Using twice weekly    . fluticasone (FLONASE) 50 MCG/ACT nasal spray instill 2 sprays into each nostril once daily 16 g 0  . ibandronate (BONIVA) 150 MG tablet Take 150 mg by mouth every 30 (thirty) days. Take in the morning with a full glass of water, on an empty stomach, and do not take anything else by mouth or lie down for the next 30 min.    . rizatriptan (MAXALT) 10 MG tablet Take 1 tablet (10 mg total) by mouth as needed for migraine. May repeat in 2 hours if needed 10 tablet 5  . sertraline (ZOLOFT) 25 MG tablet Take 1 tablet (25 mg total) by mouth daily. 30 tablet 5  . topiramate (TOPAMAX) 100 MG tablet Take 1 tablet (100 mg total) by mouth daily. 90 tablet 3   No current facility-administered medications for this visit.     Allergies as of 07/03/2017 - Review Complete 07/03/2017  Allergen Reaction Noted  . Imipramine Rash 04/24/2016    Vitals: BP  108/75   Pulse 72   Ht 5' (1.524 m)   Wt 118 lb (53.5 kg)   BMI 23.05 kg/m  Last Weight:  Wt Readings from Last 1 Encounters:  07/03/17 118 lb (53.5 kg)   ZOX:WRUE mass index is 23.05 kg/m.     Last Height:   Ht Readings from Last 1 Encounters:  07/03/17 5' (1.524 m)    Physical exam:  General: The patient is awake, alert and appears not in acute distress. The patient is well groomed. Head: Normocephalic, atraumatic. Neck is supple. Mallampati 3,  neck circumference:13.0. Nasal airflow; congestion TMJ is evident. Retrognathia is seen.  Cardiovascular:  Regular rate and rhythm, without  murmurs or carotid bruit, and without distended neck veins. Respiratory: Lungs are clear to auscultation. Skin:  Without evidence of edema, or rash Trunk: BMI is 22.6 Neurologic exam : The patient is awake and alert, oriented to place and time.   Memory subjective described as intact. Attention span & concentration ability appears normal.  Speech is fluent, without dysarthria, dysphonia or aphasia.  Mood and affect are appropriate.  Cranial nerves: Pupils are equal and briskly reactive to light. Funduscopic exam without evidence of pallor or edema.  Visual fields by finger perimetry are intact. Hearing to finger rub intact tinnitus. Facial sensation intact to fine touch. Facial motor strength is symmetric and tongue and uvula move midline. Shoulder shrug was symmetrical.   I spent more than  15  minutes of face to face time with the patient.  This was dedicated to follow up on  AIMOVIG migraine prevention.  Greater than 50% of time was spent in counseling and coordination of care. We have discussed the diagnosis and differential and I answered the patient's questions.     Assessment:  After physical and neurologic examination, review of laboratory studies,  Personal review of imaging studies, reports of other /same  Imaging studies ,  Results of polysomnography/ neurophysiology testing and  pre-existing records as far as provided in visit., my assessment is   1) Mrs. Hesse suffered from a migrainous headaches for many years but the headache has also transformed in a chronic almost daily migraine. Traditional medications used to prevent or treat headaches have had  less and less effect, and injection therapies nerve blocks Botox etc. have not had lasting effect at all. I think she was an excellent candidate for the new protein binder CGRP. She can continue using a triptan to abbreviate abort headaches.  Zonisamide for preventive.     Plan:  Treatment plan and additional workup : I like for her to try zonisamide which I have not found listed as a medication ever tried with her. Zonisamide at 25 mg bid for 30 days, than increase to 50 mg bid. Enrollment in [facility] -  Refill Sertraline and Baclofen. Rizatriptan.  *No repeat MRI, Dr Neale Keith repeated one 07-2016 .  change Lexapro for Zoloft,   (Patient wants never to use Effexor again ) RV in mid- November   Porfirio Mylar Alexzandria Massman MD  07/03/2017   CC: Alicia Headings, Md [address] [place], [place] [address]

## 2017-07-03 NOTE — Patient Instructions (Signed)
Keep a heada Recurrent Migraine Headache A migraine headache is very bad, throbbing pain that is usually on one side of your head. Recurrent migraines keep coming back (recurring). Talk with your doctor about what things may bring on (trigger) your migraine headaches. Follow these instructions at home: Medicines  Take over-the-counter and prescription medicines only as told by your doctor.  Do not drive or use heavy machinery while taking prescription pain medicine. Lifestyle  Do not use any products that contain nicotine or tobacco, such as cigarettes and e-cigarettes. If you need help quitting, ask your doctor.  Limit alcohol intake to no more than 1 drink a day for nonpregnant women and 2 drinks a day for men. One drink equals 12 oz of beer, 5 oz of wine, or 1 oz of hard liquor.  Get 7-9 hours of sleep each night.  Lessen any stress in your life. Ask your doctor about ways to lower your stress.  Stay at a healthy weight. Talk with your doctor if you need help losing weight.  Get regular exercise. General instructions  Keep a journal to find out if certain things bring on migraine headaches. For example, write down: ? What you eat and drink. ? How much sleep you get. ? Any change to your diet or medicines.  Lie down in a dark, quiet room when you have a migraine.  Try placing a cool towel over your head when you have a migraine.  Keep lights dim if bright lights bother you or make your migraines worse.  Keep all follow-up visits as told by your doctor. This is important. Contact a doctor if:  Medicine does not help your migraines.  Your pain keeps coming back.  You have a fever.  You have weight loss without trying. Get help right away if:  Your migraine becomes really bad and medicine does not help.  You have a stiff neck.  You have trouble seeing.  Your muscles are weak or you lose control of your muscles.  You lose your balance or have trouble  walking.  You feel like you will pass out (faint) or you pass out.  You have really bad symptoms that are different than your first symptoms.  You start having sudden, very bad headaches that last for one second or less, like a thunderclap. Summary  A migraine headache is very bad, throbbing pain that is usually on one side of your head.  Talk with your doctor about what things may bring on (trigger) your migraine headaches.  Take over-the-counter and prescription medicines only as told by your doctor.  Lie down in a dark, quiet room when you have a migraine.  Keep a journal about what you eat and drink, how much sleep you get, and any changes to your medicines. This can help you find out if certain things make you have migraine headaches. This information is not intended to replace advice given to you by your health care provider. Make sure you discuss any questions you have with your health care provider. Document Released: 06/12/2008 Document Revised: 07/27/2016 Document Reviewed: 07/27/2016 Elsevier Interactive Patient Education  2017 [employer] che diary.

## 2017-07-20 ENCOUNTER — Other Ambulatory Visit: Payer: Self-pay | Admitting: Oncology

## 2017-07-20 MED ORDER — METHYLPREDNISOLONE 4 MG PO TBPK: ORAL_TABLET | ORAL | 1 refills | Status: DC

## 2017-07-23 ENCOUNTER — Other Ambulatory Visit: Payer: Self-pay | Admitting: Oncology

## 2017-07-23 MED ORDER — AZITHROMYCIN 250 MG PO TABS: ORAL_TABLET | ORAL | 0 refills | Status: AC

## 2017-08-04 ENCOUNTER — Other Ambulatory Visit: Payer: Self-pay | Admitting: Oncology

## 2017-08-04 MED ORDER — METHYLPREDNISOLONE 4 MG PO TBPK: ORAL_TABLET | ORAL | 1 refills | Status: DC

## 2017-08-06 ENCOUNTER — Encounter: Payer: Self-pay | Admitting: Neurology

## 2017-08-06 ENCOUNTER — Ambulatory Visit: Payer: 59 | Admitting: Neurology

## 2017-08-06 VITALS — BP 101/64 | HR 91 | Ht 60.0 in | Wt 118.0 lb

## 2017-08-06 DIAGNOSIS — G43711 Chronic migraine without aura, intractable, with status migrainosus: Secondary | ICD-10-CM | POA: Diagnosis not present

## 2017-08-06 NOTE — Patient Instructions (Signed)
aimovig

## 2017-08-06 NOTE — Progress Notes (Signed)
Provider:  Melvyn Novasarmen  Xiao Graul, M D  Referring Provider: Madelin HeadingsPanosh, Wanda K, MD Primary Care Physician:  Madelin HeadingsPanosh, Wanda K, MD  Chief Complaint  Patient presents with  . Follow-up    Patient says the headaches are the same, typically better in the winter.    HPI:  Dolphus Jennyancy S Bhakta is a 66 y.o. female, seen here on 08-06-2017, she went for several weeks to [place] and was smitten and felt relaxed.   She had Aimovig injection single dose 2 month ago, double dose one month ago and is here for third injection.  Overall, Mrs. Cyndie ChimeGranfortuna states that winter is usually a better time for her but feels that this has been true for this winter as well independent of any medication of any medication effect.  We will give her today her third dose. She advised me that it was just 14 days ago that she took a double dose, so we will wait another 14 days for next and single dose. Another 30 days after that. headache diary.    Interval history from 07/03/2017. [name] has been able to use AIMOVIG at 70 mg twice or what interval of 6 weeks. She has not seen a reduction in migraine frequency, intensity, and no change in migrainous character. We are discussing today to either switch to another CR BG medication or to give 140 mg dose a trial. I opted to first try the 140 mg dosing, and if this does not give her success with in the next 3 weeks I will change her to use the TEVA CRBG.  She agrees with this plan. She plans a journey to [place] and we will meet after her travels. She continues to avoid wine and chocolate.     Interval history from 03/12/2017. Mrs. Oletta LamasGraham Fortuna confirms that her migraine pattern has not changed, neither in frequency nor intensity. We are talking today about the enrollment into a monoclonal antibody therapy for migraine treatment, the drug is called AIMOVIG Dorise Hiss( erenumab-aooe). Once a month. Leave the injector out of the refrigeration for 30 minutes and use subcutaneously in  the thigh or abdomen, once monthly 70 mg or  140 mg in 2 ml.  She can get 4 pens in the trial. Nurse Victorino DikeJennifer has instructed her.    Mrs. Cyndie ChimeGranfortuna describes a almost lifelong history of migraines. She never experienced auras, she never experienced one-sided headaches and for this reason presumed that she did not have migraine headaches. Her headaches would classified originally as tension headaches and treated as such. It was after she got married that she was diagnosed with migraine headaches and Imitrex was used for therapy, with some success. The headaches begun as a teenager around age 66 and for related to her menstrual cycle. 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 This has included antidepressants, and antiepileptic. The latest medication treatment is by Lexapro and topiramate. She has tried to Du PontCephaly device- she feels some relief from that.  Most intense are her headache in the afternoon but she does frequently wake up with them. Headaches also wake her from sleep at night. These are pounding and felt strongest at the for head and the nape of the neck. She has seen Dr. Neale BurlyFreeman by referral of Dr. Felicity CoyerLeschber the past, the headache was associated with nausea the time photophobia, phonophobia and the symptoms increased if she moved. Dizziness was often present or at least lightheadedness. She really rarely  had to vomit. She does not report visual aura, olfactory aura. Triggers that she has identified a bright light and weather changes. Not every headache every day is a migraine Dr. Neale Burly also obtained a headache impact test she scored 63 points- over 60 is considered severe impairment. I would like to list. Medications that have been tried and have failed, these have included amitriptyline, Cymbalta, visit for mean, doxepin, Effexor, imipramine, Lexapro, nortriptyline, Prozac and Vivactil. She has undergone procedural  therapies including Botox, Nerve blocks and trigger point injections these have not helped at all. She has tried biofeedback without success, she attributes an increase in bruxism activity to Lexapro. She has had multiple MRIs over the years and apparently have never shown an abnormality. She has thus far not had any option to try CG RP medications. These are going to be introduced later this year.  Chief complaint according to patient : Headaches, migraines, daily photophobia and phonophobia. Bruxism. Sleep habits are as follows: Her usual bedtime is around 10 PM, will take her up to an hour to go to sleep. She does not watch TV in bed. Her husband has rather restless sleep she reports. He snores, she does not. The patient reports that she usually cannot sleep through the night and wakes up 4 maybe 5 times each night. She has resorted to listen to audiobooks and this way feels that it is easier to reenter sleep. He does not look at the clock when she wakes up at night, she usually stays in bed. She has at least one time nocturia at night. She rises usually at 8 AM, she is spontaneously awake at this time, after 6 or 7 hours of nocturnal sleep. She does not take daytime naps.  Medical history and family  history: The patient's mother also struggles with insomnia and frequent sleep arousals and also listens to audiobooks. Her daughter seems not to have sleep problems. The patient's sister was involved in a motor vehicle accident which resulted in traumatic brain injury and intractable headaches of migrainous character. These were not present prior to the accident. Her mother has migraines as well. Maternal grandmother had migraines. Paternal uncle gets migraines .   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  She likes to garden and to go to [employer].  The patient is not using tobacco products, she drinks occasionally a glass of wine on weekends, she drinks decaffeinated coffee.  He does not drink sodas or iced tea.  Review of Systems: Out of a complete 14 system review, the patient complains of only the following symptoms, and all other reviewed systems are negative.   Social History   Socioeconomic History  . Marital status: Married    Spouse name: Not on file  . Number of children: 1  . Years of education: 32  . Highest education level: Not on file  Social Needs  . Financial resource strain: Not on file  . Food insecurity - worry: Not on file  . Food insecurity - inability: Not on file  . Transportation needs - medical: Not on file  . Transportation needs - non-medical: Not on file  Occupational History  . Occupation: Retired    Comment: PT  Tobacco Use  . Smoking status: Never Smoker  . Smokeless tobacco: Never Used  . Tobacco comment: married, retired PT  Substance and Sexual Activity  . Alcohol use: Yes    Comment: "Couple glasses of wine"  . Drug use: No  . Sexual activity:  Not on file  Other Topics Concern  . Not on file  Social History Narrative   8 hours of sleep per night   Lives with Dr. 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 Hyperlipidemia Mother   . Renal cancer Mother   . Hypertension Father   . Stroke Father     Past Medical History:  Diagnosis Date  . Allergic rhinitis, cause unspecified   . Headaches, cluster   . History of chicken pox   . Migraine headache     Past Surgical History:  Procedure Laterality Date  . TONSILLECTOMY  1955    Current Outpatient Medications  Medication Sig Dispense Refill  . baclofen (LIORESAL) 10 MG tablet Take 1 tablet (10 mg total) by mouth daily as needed for muscle spasms. 90 each 1  . Erenumab-aooe 70 MG/ML SOAJ Inject 140 mg into the skin every 30 (thirty) days. 4 pen 2  . Estradiol (YUVAFEM) 10 MCG TABS vaginal tablet Place vaginally. Using twice weekly    . fluticasone (FLONASE) 50 MCG/ACT nasal spray  instill 2 sprays into each nostril once daily 16 g 0  . ibandronate (BONIVA) 150 MG tablet Take 150 mg by mouth every 30 (thirty) days. Take in the morning with a full glass of water, on an empty stomach, and do not take anything else by mouth or lie down for the next 30 min.    . methylPREDNISolone (MEDROL DOSEPAK) 4 MG TBPK tablet Use as directed 21 tablet 1  . rizatriptan (MAXALT) 10 MG tablet Take 1 tablet (10 mg total) by mouth as needed for migraine. May repeat in 2 hours if needed 10 tablet 5  . sertraline (ZOLOFT) 25 MG tablet Take 1 tablet (25 mg total) by mouth daily. 90 tablet 3  . topiramate (TOPAMAX) 100 MG tablet Take 1 tablet (100 mg total) by mouth daily. 90 tablet 3   No current facility-administered medications for this visit.     Allergies as of 08/06/2017 - Review Complete 08/06/2017  Allergen Reaction Noted  . Imipramine Rash 04/24/2016    Vitals: BP 101/64   Pulse 91   Ht 5' (1.524 m)   Wt 118 lb (53.5 kg)   BMI 23.05 kg/m  Last Weight:  Wt Readings from Last 1 Encounters:  08/06/17 118 lb (53.5 kg)   ZOX:WRUEBMI:Body mass index is 23.05 kg/m.     Last Height:   Ht Readings from Last 1 Encounters:  08/06/17 5' (1.524 m)    Physical exam:  General: The patient is awake, alert and appears not in acute distress. The patient is well groomed. Head: Normocephalic, atraumatic. Neck is supple. Mallampati 3,  neck circumference:13.0. Nasal airflow; congestion TMJ is evident. Retrognathia is seen.  Cardiovascular:  Regular rate and rhythm, without  murmurs or carotid bruit, and without distended neck veins. Respiratory: Lungs are clear to auscultation. Skin:  Without evidence of edema, or rash Trunk: BMI is 22.6 Neurologic exam : The patient is awake and alert, oriented to place and time.   Memory subjective described as intact. Attention span & concentration ability appears normal.  Speech is fluent, without dysarthria, dysphonia or aphasia.  Mood and affect are  appropriate.  Cranial nerves:Pupils are equal and briskly reactive to light.  Visual fields by finger perimetry are intact. Hearing to finger rub intact tinnitus. Facial sensation intact to fine touch.  Facial motor strength  is symmetric. Shoulder shrug was symmetrical.   I spent more than 15  minutes of face to face time with the patient.  This was dedicated to follow up on AIMOVIG migraine prevention.  Greater than 80% of time was spent in counseling and coordination of care.  We have discussed the diagnosis and differential and I answered the patient's questions.     Assessment:  After physical and neurologic examination, review of laboratory studies,  Personal review of imaging studies, reports of other /same  Imaging studies ,  Results of polysomnography/ neurophysiology testing and pre-existing records as far as provided in visit., my assessment is   1) Mrs. Huntoon suffered from a migrainous headaches for many, many  years but the headache has also transformed in a chronic almost daily migraine. Traditional medications used to prevent or treat headaches have had less and less effect, and injection therapies nerve blocks Botox etc. have not had lasting effect at all. I think she was an excellent candidate for the new protein binder CGRP. Today 08-06-2017 third dose sample if given for at home use.  She can continue using a triptan to abbreviate abort headaches.       Porfirio Mylar Jesstin Studstill MD  08/06/2017   CC: Madelin Headings, Md [address] [place], [place] [address]

## 2017-09-05 ENCOUNTER — Telehealth: Payer: Self-pay | Admitting: Neurology

## 2017-09-05 NOTE — Telephone Encounter (Signed)
Meigan/covermymeds [phone] [id number] ref# [id number] called to advise the pt was enrolled by the office and the aimovig ally access card will expire soon, it will need a PA to continue.

## 2017-09-06 NOTE — Telephone Encounter (Signed)
PA submitted for the patient for Aimovig. KEY [id number]. Please allow up to 5 days for response

## 2017-09-12 NOTE — Telephone Encounter (Signed)
PA approved for aimovig from 09/09/2017-03/09/2018. Approved by [employer]. Reference number [id number]

## 2017-12-04 ENCOUNTER — Encounter: Payer: Self-pay | Admitting: Adult Health

## 2017-12-04 ENCOUNTER — Telehealth: Payer: Self-pay | Admitting: *Deleted

## 2017-12-04 ENCOUNTER — Ambulatory Visit: Payer: 59 | Admitting: Adult Health

## 2017-12-04 VITALS — BP 97/69 | HR 80 | Wt 119.2 lb

## 2017-12-04 DIAGNOSIS — G43719 Chronic migraine without aura, intractable, without status migrainosus: Secondary | ICD-10-CM

## 2017-12-04 MED ORDER — FREMANEZUMAB-VFRM 225 MG/1.5ML ~~LOC~~ SOSY
225.0000 mg | PREFILLED_SYRINGE | SUBCUTANEOUS | 5 refills | Status: DC
Start: 2017-12-04 — End: 2018-05-29

## 2017-12-04 NOTE — Telephone Encounter (Signed)
Faxed Ajovy PA form with answers to PA questions to [employer].

## 2017-12-04 NOTE — Patient Instructions (Signed)
Your Plan:  Stop Aimovig Start Ajovy  If your symptoms worsen or you develop new symptoms please let us know.    Thank you for coming to see [facility] at [facility]. I hope we have been able to provide you high quality care today.  You may receive a patient satisfaction survey over the next few weeks. We would appreciate your feedback and comments so that we may continue to improve ourselves and the health of our patients.

## 2017-12-04 NOTE — Progress Notes (Signed)
PATIENT: Alicia JennyNancy S Keith DOB: 12/20/1950  REASON FOR VISIT: follow up HISTORY FROM: patient  HISTORY OF PRESENT ILLNESS: Today 12/04/17:  Alicia Keith is a 67 year old female with a history of chronic migraine headaches.  She returns today for follow-up.  She is been taking them as Aimovig.  Reports that she has done for injection but has not seen the benefit.  Reports that she continues to have 20-25 headaches a month.  She reports that she has had 5 severe headaches for which she had to use Maxalt.  She reports that her headaches typically located in the frontal and occipital region.  She reports with severe headache she does have photophobia, phonophobia, nausea but no vomiting.  The patient has tried multiple medications in the past including amitriptyline, Cymbalta, doxepin, Effexor, imipramine, Lexapro, nortriptyline, Prozac, inderal, topamax, gabapentin, botox, trigger point injections.  She is also tried biofeedback with no benefit.  She returns today for evaluation.  HISTORY  (Copied from Dr. Oliva Bustardohmeier's last note): Alicia Keith is a 67 y.o. female, seen here on 08-06-2017, she went for several weeks to [place] and was smitten and felt relaxed.   She had Aimovig injection single dose 2 month ago, double dose one month ago and is here for third injection.  Overall, Alicia Keith states that winter is usually a better time for her but feels that this has been true for this winter as well independent of any medication of any medication effect.  We will give her today her third dose. She advised me that it was just 14 days ago that she took a double dose, so we will wait another 14 days for next and single dose. Another 30 days after that. headache diary.    Interval history from 07/03/2017. Alicia Keith has been able to use AIMOVIG at 70 mg twice or what interval of 6 weeks. She has not seen a reduction in migraine frequency, intensity, and no change in migrainous  character. We are discussing today to either switch to another CR BG medication or to give 140 mg dose a trial. I opted to first try the 140 mg dosing, and if this does not give her success with in the next 3 weeks I will change her to use the TEVA CRBG.  She agrees with this plan. She plans a journey to [place] and we will meet after her travels. She continues to avoid wine and chocolate.     Interval history from 03/12/2017. Alicia Keith confirms that her migraine pattern has not changed, neither in frequency nor intensity. We are talking today about the enrollment into a monoclonal antibody therapy for migraine treatment, the drug is called AIMOVIG Dorise Hiss( erenumab-aooe). Once a month. Leave the injector out of the refrigeration for 30 minutes and use subcutaneously in the thigh or abdomen, once monthly 70 mg or  140 mg in 2 ml.  She can get 4 pens in the trial. Nurse Victorino DikeJennifer has instructed her.    Alicia Keith describes a almost lifelong history of migraines. She never experienced auras, she never experienced one-sided headaches and for this reason presumed that she did not have migraine headaches. Her headaches would classified originally as tension headaches and treated as such. It was after she got married that she was diagnosed with migraine headaches and Imitrex was used for therapy, with some success. The headaches begun as a teenager around age 67 and for related to her menstrual cycle. These catamenial type headaches changed later  to an almost daily basis, and prophylactic medication was introduced. This has included antidepressants, and antiepileptic. The latest medication treatment is by Lexapro and topiramate. She has tried to [employer] device- she feels some relief from that.  Most intense are her headache in the afternoon but she does frequently wake up with them. Headaches also wake her from sleep at night. These are pounding and felt strongest at the for head and the nape of  the neck. She has seen Dr. Neale Burly by referral of Dr. Felicity Coyer the past, the headache was associated with nausea the time photophobia, phonophobia and the symptoms increased if she moved. Dizziness was often present or at least lightheadedness. She really rarely had to vomit. She does not report visual aura, olfactory aura. Triggers that she has identified a bright light and weather changes. Not every headache every day is a migraine Dr. Neale Burly also obtained a headache impact test she scored 63 points- over 60 is considered severe impairment. I would like to list. Medications that have been tried and have failed, these have included amitriptyline, Cymbalta, visit for mean, doxepin, Effexor, imipramine, Lexapro, nortriptyline, Prozac and Vivactil. She has undergone procedural therapies including Botox, Nerve blocks and trigger point injections these have not helped at all. She has tried biofeedback without success, she attributes an increase in bruxism activity to Lexapro. She has had multiple MRIs over the years and apparently have never shown an abnormality. She has thus far not had any option to try CG RP medications. These are going to be introduced later this year.  Chief complaint according to patient : Headaches, migraines, daily photophobia and phonophobia. Bruxism. Sleep habits are as follows: Her usual bedtime is around 10 PM, will take her up to an hour to go to sleep. She does not watch TV in bed. Her husband has rather restless sleep she reports. He snores, she does not. The patient reports that she usually cannot sleep through the night and wakes up 4 maybe 5 times each night. She has resorted to listen to audiobooks and this way feels that it is easier to reenter sleep. He does not look at the clock when she wakes up at night, she usually stays in bed. She has at least one time nocturia at night. She rises usually at 8 AM, she is spontaneously awake at this time, after 6 or 7 hours of  nocturnal sleep. She does not take daytime naps.  Medical history and family  history: The patient's mother also struggles with insomnia and frequent sleep arousals and also listens to audiobooks. Her daughter seems not to have sleep problems. The patient's sister was involved in a motor vehicle accident which resulted in traumatic brain injury and intractable headaches of migrainous character. These were not present prior to the accident. Her mother has migraines as well. Maternal grandmother had migraines. Paternal uncle gets migraines .   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  She likes to garden and to go to [employer].  The patient is not using tobacco products, she drinks occasionally a glass of wine on weekends, she drinks decaffeinated coffee. He does not drink sodas or iced tea.   REVIEW OF SYSTEMS: Out of a complete 14 system review of symptoms, the patient complains only of the following symptoms, and all other reviewed systems are negative.  See HPI  ALLERGIES: Allergies  Allergen Reactions  . Imipramine Rash    HOME MEDICATIONS: Outpatient Medications Prior to Visit  Medication Sig Dispense Refill  .  baclofen (LIORESAL) 10 MG tablet Take 1 tablet (10 mg total) by mouth daily as needed for muscle spasms. 90 each 1  . Estradiol (YUVAFEM) 10 MCG TABS vaginal tablet Place vaginally. Using twice weekly    . fluticasone (FLONASE) 50 MCG/ACT nasal spray instill 2 sprays into each nostril once daily 16 g 0  . ibandronate (BONIVA) 150 MG tablet Take 150 mg by mouth every 30 (thirty) days. Take in the morning with a full glass of water, on an empty stomach, and do not take anything else by mouth or lie down for the next 30 min.    . rizatriptan (MAXALT) 10 MG tablet Take 1 tablet (10 mg total) by mouth as needed for migraine. May repeat in 2 hours if needed 10 tablet 5  . sertraline (ZOLOFT) 25 MG tablet Take 1 tablet (25 mg total) by mouth daily. 90  tablet 3  . topiramate (TOPAMAX) 100 MG tablet Take 1 tablet (100 mg total) by mouth daily. 90 tablet 3  . Erenumab-aooe 70 MG/ML SOAJ Inject 140 mg into the skin every 30 (thirty) days. 4 pen 2  . methylPREDNISolone (MEDROL DOSEPAK) 4 MG TBPK tablet Use as directed 21 tablet 1   No facility-administered medications prior to visit.     PAST MEDICAL HISTORY: Past Medical History:  Diagnosis Date  . Allergic rhinitis, cause unspecified   . Headaches, cluster   . History of chicken pox   . Migraine headache     PAST SURGICAL HISTORY: Past Surgical History:  Procedure Laterality Date  . TONSILLECTOMY  1955    FAMILY HISTORY: Family History  Problem Relation Age of Onset  . Hyperlipidemia Mother   . Renal cancer Mother   . Hypertension Father   . Stroke Father     SOCIAL HISTORY: Social History   Socioeconomic History  . Marital status: Married    Spouse name: Not on file  . Number of children: 1  . Years of education: 44  . Highest education level: Not on file  Social Needs  . Financial resource strain: Not on file  . Food insecurity - worry: Not on file  . Food insecurity - inability: Not on file  . Transportation needs - medical: Not on file  . Transportation needs - non-medical: Not on file  Occupational History  . Occupation: Retired    Comment: PT  Tobacco Use  . Smoking status: Never Smoker  . Smokeless tobacco: Never Used  . Tobacco comment: married, retired PT  Substance and Sexual Activity  . Alcohol use: Yes    Comment: "Couple glasses of wine"  . Drug use: No  . Sexual activity: Not on file  Other Topics Concern  . Not on file  Social History Narrative   8 hours of sleep per night   Lives with Dr. 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  Generalized: Well developed, in no acute  distress   Neurological examination  Mentation: Alert oriented to time, place, history taking. Follows all commands speech and language fluent Cranial nerve II-XII: Pupils were equal round reactive to light. Extraocular movements were full, visual field were full on confrontational test. Facial sensation and strength were normal. Uvula tongue midline. Head turning and shoulder shrug  were normal  and symmetric. Motor: The motor testing reveals 5 over 5 strength of all 4 extremities. Good symmetric motor tone is noted throughout.  Sensory: Sensory testing is intact to soft touch on all 4 extremities. No evidence of extinction is noted.  Coordination: Cerebellar testing reveals good finger-nose-finger and heel-to-shin bilaterally.  Gait and station: Gait is normal. Tandem gait is normal. Romberg is negative. No drift is seen.  Reflexes: Deep tendon reflexes are symmetric and normal bilaterally.   DIAGNOSTIC DATA (LABS, IMAGING, TESTING) - I reviewed patient records, labs, notes, testing and imaging myself where available.  Lab Results  Component Value Date   WBC ----- 06/06/2016   HGB 12.5 06/06/2016   HCT 37.2 06/06/2016   MCV 84.4 06/06/2016   PLT 108.0 (L) 06/06/2016      Component Value Date/Time   NA 142 03/12/2017 1346   K 4.8 03/12/2017 1346   CL 104 03/12/2017 1346   CO2 24 03/12/2017 1346   GLUCOSE 95 03/12/2017 1346   GLUCOSE 79 06/06/2016 1320   BUN 20 03/12/2017 1346   CREATININE 0.88 03/12/2017 1346   CALCIUM 9.5 03/12/2017 1346   PROT 6.7 03/12/2017 1346   ALBUMIN 4.2 03/12/2017 1346   AST 21 03/12/2017 1346   ALT 21 03/12/2017 1346   ALKPHOS 52 03/12/2017 1346   BILITOT <0.2 03/12/2017 1346   GFRNONAA 68 03/12/2017 1346   GFRAA 79 03/12/2017 1346   Lab Results  Component Value Date   CHOL 255 (A) 11/14/2015   HDL 91 (A) 11/14/2015   LDLCALC 151 11/14/2015   TRIG 65 11/14/2015      ASSESSMENT AND PLAN 67 y.o. year old female  has a past medical history  of Allergic rhinitis, cause unspecified, Headaches, cluster, History of chicken pox, and Migraine headache. here with:  1.  Intractable migraine headaches  The patient continues to have migraine headaches.  We discussed potential options.  We discussed potentially trying Ajovy or perhaps a referral to a headache clinic.  The patient reports that she would like to try Ajovy. Her last dose of Aimovig was in January.  She will discontinue this medication.  She will continue on Topamax Zoloft and Maxalt.  Advised that if her headaches do not improve she should let us know.  She will follow-up in 6 months or sooner if needed.   I spent 15 minutes with the patient. 50% of this time was spent discussing her medication   Butch Penny, MSN, NP-C 12/04/2017, 4:14 PM [facility] [address], [address] [place], [place] [address] [phone]

## 2017-12-04 NOTE — Progress Notes (Signed)
I agree with the assessment and plan as directed by NP .The patient is known to me .   Johnell Landowski, MD  

## 2017-12-11 DIAGNOSIS — Z6822 Body mass index (BMI) 22.0-22.9, adult: Secondary | ICD-10-CM | POA: Diagnosis not present

## 2017-12-11 DIAGNOSIS — Z01419 Encounter for gynecological examination (general) (routine) without abnormal findings: Secondary | ICD-10-CM | POA: Diagnosis not present

## 2017-12-11 DIAGNOSIS — M810 Age-related osteoporosis without current pathological fracture: Secondary | ICD-10-CM | POA: Diagnosis not present

## 2017-12-11 DIAGNOSIS — Z1231 Encounter for screening mammogram for malignant neoplasm of breast: Secondary | ICD-10-CM | POA: Diagnosis not present

## 2017-12-11 NOTE — Telephone Encounter (Signed)
Called [employer] to check on Ajovy PA. Spoke with Apolinar JunesBrandon who stated it was approved on 12/04/17. This RN advised the PA decision was not received by this office. Apolinar JunesBrandon stated he would fax another copy, PA # [id number]. Effective dates : 12/04/17 - 06/05/18.

## 2018-01-23 ENCOUNTER — Other Ambulatory Visit: Payer: Self-pay | Admitting: Neurology

## 2018-02-27 ENCOUNTER — Telehealth: Payer: Self-pay | Admitting: Family Medicine

## 2018-02-27 NOTE — Telephone Encounter (Signed)
Copied from CRM [id number]. Topic: Appointment Scheduling - Scheduling Inquiry for Clinic >> Feb 27, 2018  2:11 PM Tamela OddiMartin, [place], NT wrote: Reason for CRM: patient called and states she wanted to schedule an appt for an update on her vaccines. Patient states she thinks she is due for a pneumonia, shingles vaccine. Patient is requesting a call back to see if she is due for these. CB# [phone]

## 2018-02-28 NOTE — Telephone Encounter (Signed)
I called the pt and informed her of the message below.  Appt for physical scheduled for 7/10 and she is aware [facility] the Shingrix vaccine and the others can be given at her visit.

## 2018-02-28 NOTE — Telephone Encounter (Signed)
Looks like due for  pneumovax  23 And shingrix (   If we have it in supply )  And   Td booster.    She can make appt for these and may want to make appt for CPX  Any time  And get at the visit  ( As her last visit with us was 2017)

## 2018-03-11 IMAGING — DX DG CHEST 2V
2 series · 2 of 2 positions shown · non-contrast
Comparison: None in PACs

CLINICAL DATA: Follow-up of right-sided pneumonia

EXAM:
CHEST  2 VIEW

[chest pa]
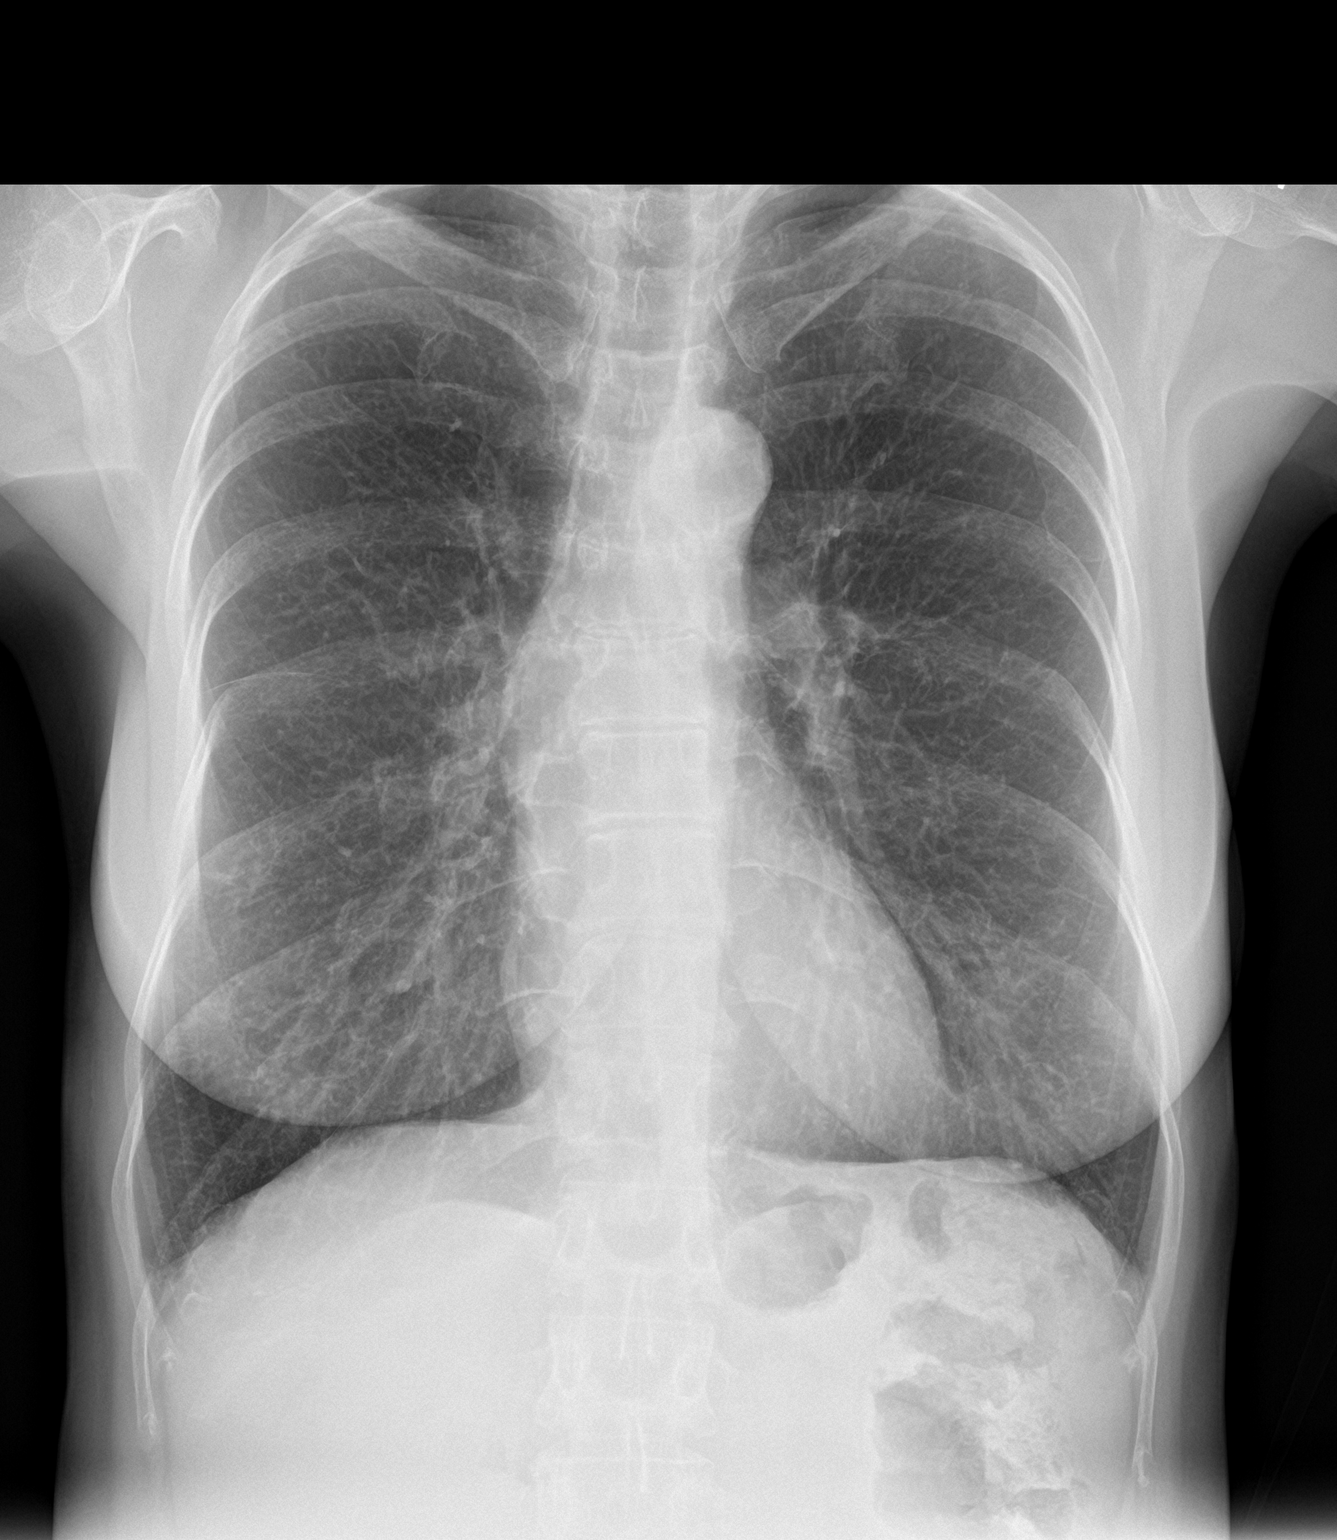

[chest lat]
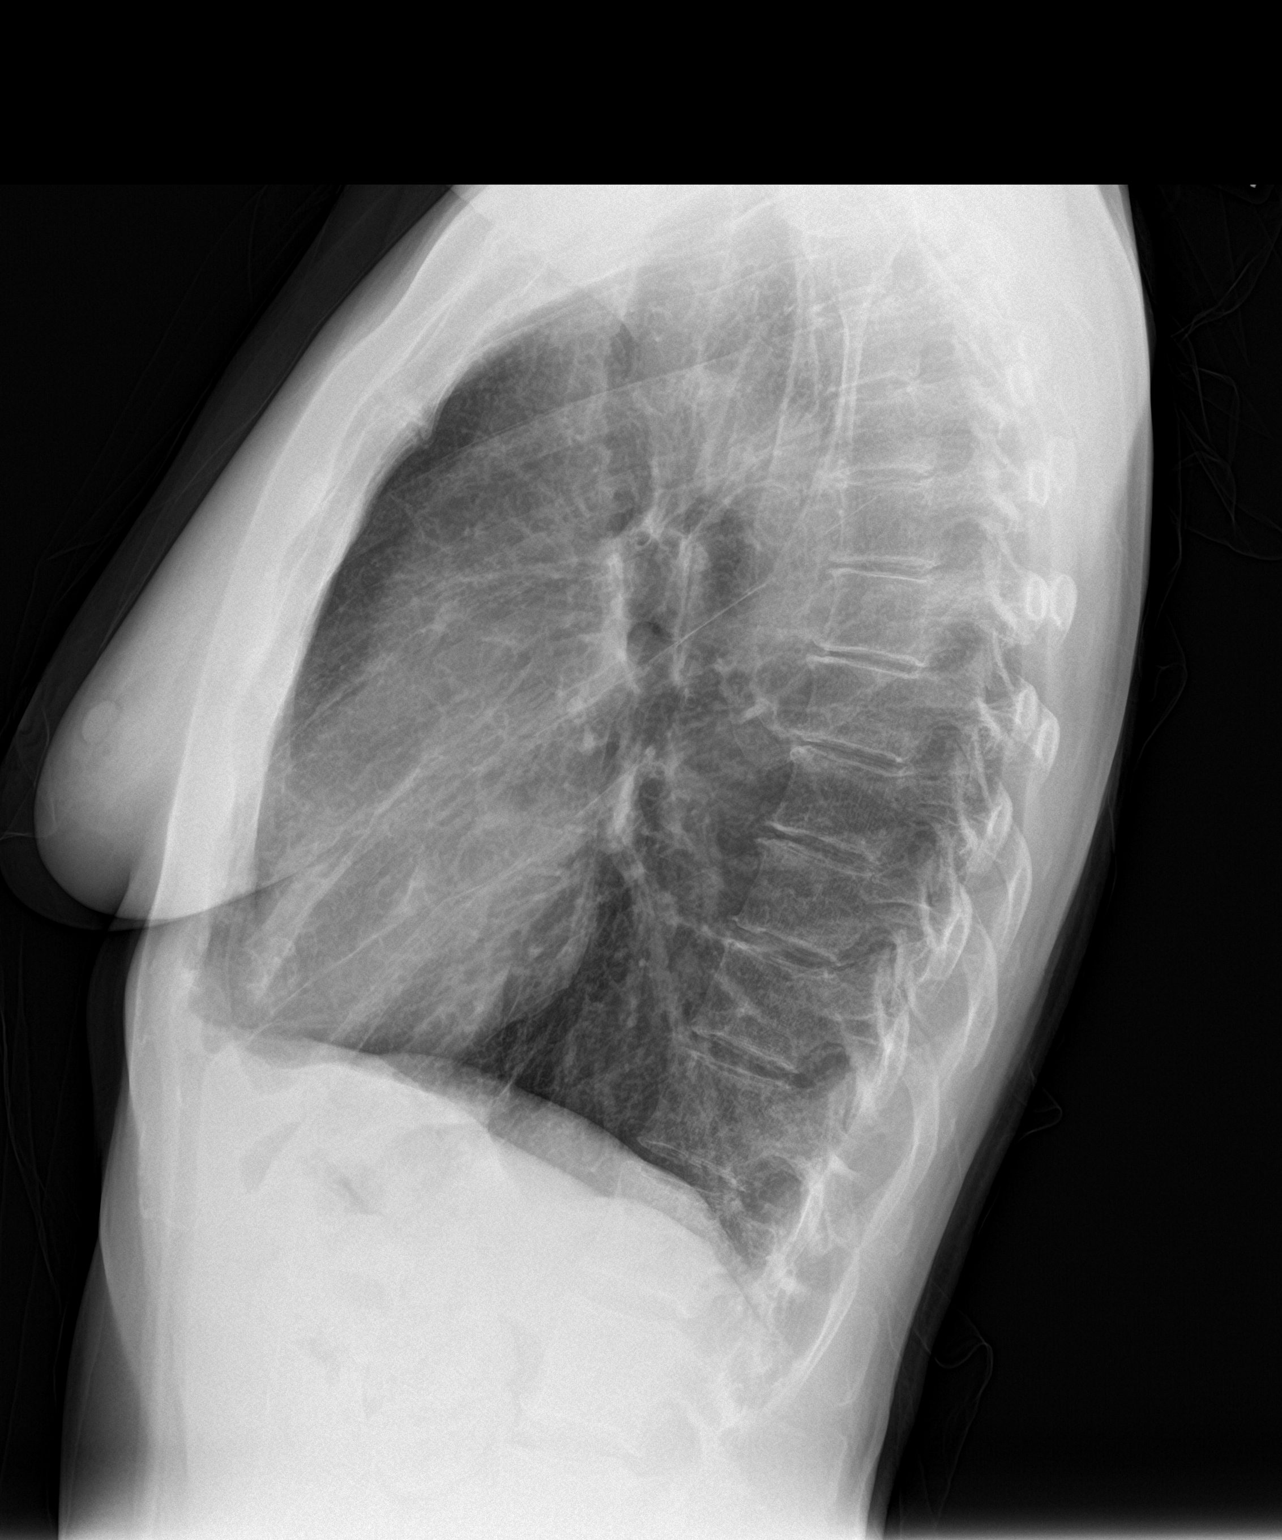

[2 of 2 positions shown; findings below may reference images not displayed]

FINDINGS: The lungs are hyperinflated. The interstitial markings are coarse.
There is no alveolar infiltrate or parenchymal masses. There is no
pleural effusion or pneumothorax. The heart and pulmonary
vascularity are normal. The mediastinum is normal in width. There is
calcification in the wall of the aortic arch. The bony thorax is
unremarkable.
IMPRESSION: COPD-reactive airway disease. No evidence of pneumonia, CHF, nor
other acute cardiopulmonary abnormality.

Aortic atherosclerosis.

## 2018-03-25 NOTE — Progress Notes (Signed)
Chief Complaint  Patient presents with  . Annual Exam    HPI: Patient  Alicia Keith  67 y.o. comes in today for Preventive Health Care visit  No ongoing problem x headache management on boniva for bone health Seen by specialty care .   Health Maintenance  Topic Date Due  . Hepatitis C Screening  [birth year]  . DEXA SCAN  05/18/2014  . TETANUS/TDAP  09/18/2015  . PNA vac Low Risk Adult (2 of 2 - PPSV23) 04/24/2017  . INFLUENZA VACCINE  04/17/2018  . MAMMOGRAM  11/27/2018  . COLONOSCOPY  01/18/2025   Health Maintenance Review LIFESTYLE:  Exercise:  30 min 5 x per week   And  Treadmill.  Plantar fasciitis  Tobacco/ETS:  no Alcohol:   2 per week  Sugar beverages: n Sleep: 8  Drug use: no HH of    2 cats  hh o r2  Work: no scduled time    ROS:  GEN/ HEENT: No fever, significant weight changes sweats headaches vision problems hearing changes, CV/ PULM; No chest pain shortness of breath cough, syncope,edema  change in exercise tolerance. GI /GU: No adominal pain, vomiting, change in bowel habits. No blood in the stool. No significant GU symptoms. SKIN/HEME: ,no acute skin rashes suspicious lesions or bleeding. No lymphadenopathy, nodules, masses.  NEURO/ PSYCH:  No neurologic signs such as weakness numbness. No depression anxiety. IMM/ Allergy: No unusual infections.  Allergy .   REST of 12 system review negative except as per HPI   Past Medical History:  Diagnosis Date  . Allergic rhinitis, cause unspecified   . Headaches, cluster   . History of chicken pox   . Migraine headache     Past Surgical History:  Procedure Laterality Date  . TONSILLECTOMY  1955    Family History  Problem Relation Age of Onset  . Hyperlipidemia Mother   . Renal cancer Mother   . Hypertension Father   . Stroke Father     Social History   Socioeconomic History  . Marital status: Married    Spouse name: Not on file  . Number of children: 1  . Years of education: 44  .  Highest education level: Not on file  Occupational History  . Occupation: Retired    Comment: PT  Social Needs  . Financial resource strain: Not on file  . Food insecurity:    Worry: Not on file    Inability: Not on file  . Transportation needs:    Medical: Not on file    Non-medical: Not on file  Tobacco Use  . Smoking status: Never Smoker  . Smokeless tobacco: Never Used  . Tobacco comment: married, retired PT  Substance and Sexual Activity  . Alcohol use: Yes    Comment: "Couple glasses of wine"  . Drug use: No  . Sexual activity: Not on file  Lifestyle  . Physical activity:    Days per week: Not on file    Minutes per session: Not on file  . Stress: Not on file  Relationships  . Social connections:    Talks on phone: Not on file    Gets together: Not on file    Attends religious service: Not on file    Active member of club or organization: Not on file    Attends meetings of clubs or organizations: Not on file    Relationship status: Not on file  Other Topics Concern  . Not on file  Social History  Narrative   8 hours of sleep per night   Lives with Dr. 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 baclofen (LIORESAL) 10 MG tablet Take 1 tablet (10 mg total) by mouth daily as needed for muscle spasms. 90 each 1  . Estradiol (YUVAFEM) 10 MCG TABS vaginal tablet Place vaginally. Using twice weekly    . fluticasone (FLONASE) 50 MCG/ACT nasal spray instill 2 sprays into each nostril once daily 16 g 0  . Fremanezumab-vfrm (AJOVY) 225 MG/1.5ML SOSY Inject 225 mg into the skin every 30 (thirty) days. 1.5 mL 5  . ibandronate (BONIVA) 150 MG tablet Take 150 mg by mouth every 30 (thirty) days. Take in the morning with a full glass of water, on an empty stomach, and do not take anything else by mouth or lie down for the next 30 min.    . rizatriptan (MAXALT) 10 MG tablet Take 1 tablet  (10 mg total) by mouth as needed for migraine. May repeat in 2 hours if needed 10 tablet 5  . sertraline (ZOLOFT) 25 MG tablet Take 1 tablet (25 mg total) by mouth daily. 90 tablet 3  . topiramate (TOPAMAX) 100 MG tablet TAKE 1 TABLET (100 MG TOTAL) BY MOUTH DAILY. 90 tablet 2   No facility-administered medications prior to visit.      EXAM:  BP 105/78   Pulse 81   Temp 97.9 F (36.6 C)   Ht 4' 11.5" (1.511 m)   Wt 119 lb (54 kg)   BMI 23.63 kg/m   Body mass index is 23.63 kg/m. Wt Readings from Last 3 Encounters:  03/26/18 119 lb (54 kg)  12/04/17 119 lb 3.2 oz (54.1 kg)  08/06/17 118 lb (53.5 kg)    Physical Exam: Vital signs reviewed WUJ:[id number]:This is a well-developed well-nourished alert cooperative    who appearsr stated age in no acute distress.  HEENT: normocephalic atraumatic , Eyes: PERRL EOM's full, conjunctiva clear, Nares: paten,t no deformity discharge or tenderness., Ears: no deformity EAC's clear TMs with normal landmarks. Mouth: clear OP, no lesions, edema.  Moist mucous membranes. Dentition in adequate repair. NECK: supple without masses, thyromegaly or bruits. CHEST/PULM:  Clear to auscultation and percussion breath sounds equal no wheeze , rales or rhonchi. No chest wall deformities or tenderness. Breast:defered said already checked  CV: PMI is nondisplaced, S1 S2 no gallops, murmurs, rubs. Peripheral pulses are full without delay.No JVD .  ABDOMEN: Bowel sounds normal nontender  No guard or rebound, no hepato splenomegal no CVA tenderness.  No hernia. Extremtities:  No clubbing cyanosis or edema, no acute joint swelling or redness no focal atrophy NEURO:  Oriented x3, cranial nerves 3-12 appear to be intact, no obvious focal weakness,gait within normal limits no abnormal reflexes or asymmetrical SKIN: No acute rashes normal turgor, color, no bruising or petechiae. Kitten scratches on le healing PSYCH: Oriented, good eye contact, no obvious depression anxiety,  cognition and judgment appear normal. LN: no cervical axillary inguinal adenopathy    BP Readings from Last 3 Encounters:  03/26/18 105/78  12/04/17 97/69  08/06/17 101/64    Lab results reviewed with patient   ASSESSMENT AND PLAN:  Discussed the following assessment and plan:  Visit for preventive health examination - Plan: Basic metabolic panel, CBC with Differential/Platelet, Hepatic function panel, Lipid panel, TSH  Elevated cholesterol - Plan: Basic metabolic panel, CBC with Differential/Platelet,  Hepatic function panel, Lipid panel, TSH  Medication management - Plan: Basic metabolic panel, CBC with Differential/Platelet, Hepatic function panel, Lipid panel, TSH  Encounter for hepatitis C screening test for low risk patient - Plan: Hepatitis C antibody  Need for pneumococcal vaccination - Plan: Pneumococcal polysaccharide vaccine 23-valent greater than or equal to 2yo subcutaneous/IM  Need for shingles vaccine - Plan: Varicella-zoster vaccine IM (Shingrix)  Need for Tdap vaccination - Plan: Tdap vaccine greater than or equal to 7yo IM  Patient Care Team: Madelin Headings, MD as PCP - General (Internal Medicine) Santiago Glad, MD (Neurology) Silverio Lay, MD (Obstetrics and Gynecology) Patient Instructions  Blood screening today.  Will notify you  of labs when available.  Continue lifestyle intervention healthy eating and exercise .  Tdap shingrix and  pneumvax 23   Can consider coronary calcium score if needed to  Get more information about CV risk from  Elevated lipids.   The 10-year ASCVD risk score Denman George DC Montez Hageman., et al., 2013) is: 5%   Values used to calculate the score:     Age: 89 years     Sex: Female     Is Non-Hispanic African American: No     Diabetic: No     Tobacco smoker: No     Systolic Blood Pressure: 105 mmHg     Is BP treated: No     HDL Cholesterol: 91 mg/dL     Total Cholesterol: 255 mg/dL    Neta Mends. Ceniya Fowers M.D.

## 2018-03-26 ENCOUNTER — Ambulatory Visit (INDEPENDENT_AMBULATORY_CARE_PROVIDER_SITE_OTHER): Payer: 59 | Admitting: Internal Medicine

## 2018-03-26 ENCOUNTER — Encounter: Payer: Self-pay | Admitting: Internal Medicine

## 2018-03-26 VITALS — BP 105/78 | HR 81 | Temp 97.9°F | Ht 59.5 in | Wt 119.0 lb

## 2018-03-26 DIAGNOSIS — E7800 Pure hypercholesterolemia, unspecified: Secondary | ICD-10-CM

## 2018-03-26 DIAGNOSIS — Z1159 Encounter for screening for other viral diseases: Secondary | ICD-10-CM

## 2018-03-26 DIAGNOSIS — Z23 Encounter for immunization: Secondary | ICD-10-CM

## 2018-03-26 DIAGNOSIS — Z79899 Other long term (current) drug therapy: Secondary | ICD-10-CM | POA: Diagnosis not present

## 2018-03-26 DIAGNOSIS — Z0000 Encounter for general adult medical examination without abnormal findings: Secondary | ICD-10-CM | POA: Diagnosis not present

## 2018-03-26 NOTE — Patient Instructions (Addendum)
Blood screening today.  Will notify you  of labs when available.  Continue lifestyle intervention healthy eating and exercise .  Tdap shingrix and  pneumvax 23   Can consider coronary calcium score if needed to  Get more information about CV risk from  Elevated lipids.   The 10-year ASCVD risk score Denman George(Goff DC Montez HagemanJr., et al., 2013) is: 5%   Values used to calculate the score:     Age: [age] years     Sex: Female     Is Non-Hispanic African American: No     Diabetic: No     Tobacco smoker: No     Systolic Blood Pressure: 105 mmHg     Is BP treated: No     HDL Cholesterol: 91 mg/dL     Total Cholesterol: 255 mg/dL

## 2018-03-27 LAB — CBC WITH DIFFERENTIAL/PLATELET
BASOS ABS: 0 10*3/uL (ref 0.0–0.1)
Basophils Relative: 0.6 % (ref 0.0–3.0)
EOS ABS: 0.2 10*3/uL (ref 0.0–0.7)
Eosinophils Relative: 2.3 % (ref 0.0–5.0)
HCT: 37.2 % (ref 36.0–46.0)
Hemoglobin: 12.3 g/dL (ref 12.0–15.0)
LYMPHS ABS: 1.6 10*3/uL (ref 0.7–4.0)
Lymphocytes Relative: 23 % (ref 12.0–46.0)
MCHC: 33.1 g/dL (ref 30.0–36.0)
MCV: 86.9 fl (ref 78.0–100.0)
MONO ABS: 0.6 10*3/uL (ref 0.1–1.0)
Monocytes Relative: 7.7 % (ref 3.0–12.0)
NEUTROS ABS: 4.7 10*3/uL (ref 1.4–7.7)
NEUTROS PCT: 66.4 % (ref 43.0–77.0)
PLATELETS: 134 10*3/uL — AB (ref 150.0–400.0)
RBC: 4.28 Mil/uL (ref 3.87–5.11)
RDW: 13.8 % (ref 11.5–15.5)
WBC: 7.1 10*3/uL (ref 4.0–10.5)

## 2018-03-27 LAB — HEPATIC FUNCTION PANEL
ALT: 15 U/L (ref 0–35)
AST: 19 U/L (ref 0–37)
Albumin: 4.2 g/dL (ref 3.5–5.2)
Alkaline Phosphatase: 46 U/L (ref 39–117)
BILIRUBIN DIRECT: 0.1 mg/dL (ref 0.0–0.3)
Total Bilirubin: 0.3 mg/dL (ref 0.2–1.2)
Total Protein: 6.8 g/dL (ref 6.0–8.3)

## 2018-03-27 LAB — BASIC METABOLIC PANEL
BUN: 25 mg/dL — AB (ref 6–23)
CALCIUM: 9.3 mg/dL (ref 8.4–10.5)
CO2: 27 mEq/L (ref 19–32)
CREATININE: 0.86 mg/dL (ref 0.40–1.20)
Chloride: 102 mEq/L (ref 96–112)
GFR: 69.57 mL/min (ref >60.00)
GLUCOSE: 90 mg/dL (ref 70–99)
Potassium: 4.3 mEq/L (ref 3.5–5.1)
Sodium: 138 mEq/L (ref 135–145)

## 2018-03-27 LAB — LIPID PANEL
CHOL/HDL RATIO: 3
Cholesterol: 241 mg/dL — ABNORMAL HIGH (ref 0–200)
HDL: 72.9 mg/dL (ref >39.00)
LDL Cholesterol: 137 mg/dL — ABNORMAL HIGH (ref 0–99)
NonHDL: 167.77
TRIGLYCERIDES: 152 mg/dL — AB (ref 0.0–149.0)
VLDL: 30.4 mg/dL (ref 0.0–40.0)

## 2018-03-27 LAB — HEPATITIS C ANTIBODY
Hepatitis C Ab: NONREACTIVE
SIGNAL TO CUT-OFF: 0.02 (ref <1.00)

## 2018-03-27 LAB — TSH: TSH: 1.46 u[IU]/mL (ref 0.35–4.50)

## 2018-05-28 ENCOUNTER — Ambulatory Visit (INDEPENDENT_AMBULATORY_CARE_PROVIDER_SITE_OTHER): Payer: 59 | Admitting: *Deleted

## 2018-05-28 DIAGNOSIS — Z23 Encounter for immunization: Secondary | ICD-10-CM

## 2018-05-29 ENCOUNTER — Other Ambulatory Visit: Payer: Self-pay | Admitting: Adult Health

## 2018-06-09 ENCOUNTER — Ambulatory Visit: Payer: 59 | Admitting: Neurology

## 2018-06-09 ENCOUNTER — Encounter: Payer: Self-pay | Admitting: Neurology

## 2018-06-09 ENCOUNTER — Other Ambulatory Visit: Payer: Self-pay | Admitting: Neurology

## 2018-06-09 VITALS — BP 102/70 | HR 97 | Ht 60.5 in | Wt 114.0 lb

## 2018-06-09 DIAGNOSIS — G43711 Chronic migraine without aura, intractable, with status migrainosus: Secondary | ICD-10-CM

## 2018-06-09 MED ORDER — FREMANEZUMAB-VFRM 225 MG/1.5ML ~~LOC~~ SOSY: 1.5000 mL | PREFILLED_SYRINGE | SUBCUTANEOUS | 11 refills | Status: DC

## 2018-06-09 MED ORDER — FREMANEZUMAB-VFRM 225 MG/1.5ML ~~LOC~~ SOSY: 1.5000 mL | PREFILLED_SYRINGE | SUBCUTANEOUS | 5 refills | Status: DC

## 2018-06-09 NOTE — Progress Notes (Signed)
Provider:  Melvyn Novas, M D  Referring Provider: Madelin Headings, MD Primary Care Physician:  Madelin Headings, MD  Chief Complaint  Patient presents with  . Follow-up    pt alone, rm 11 pt states the medications are working well and there are no concerns or issues to bring up.    HPI:  Alicia Keith is a 67 y.o. female, was last seen on 12-04-2017 by Butch Penny, Np.  I the pleasure of seeing her today on 09 June 2018 and a 6 monthly visit.  The patient has tolerated a jewelry very well, but her last injection which was her seventh injection did create an injection site reaction and elevation of the skin reddened and was about a 2 inch diameter.  The injection site reaction did not last, she went to sleep in the next morning there was no longer any sign of it.  Given this I would like her to continue the use of Ajovy. I will prescribe another 6 month. Reduction of HA frequency is reduced by 50% , and the intensity is down as well. *Less than 8 a month.      08-06-2017, she went for several weeks to [place] and was smitten and felt relaxed.   She had Aimovig injection single dose 2 month ago, double dose one month ago and is here for third injection.  Overall, Alicia Keith states that winter is usually a better time for her but feels that this has been true for this winter as well independent of any medication of any medication effect.  We will give her today her third dose. She advised me that it was just 14 days ago that she took a double dose, so we will wait another 14 days for next and single dose. Another 30 days after that. headache diary.    Interval history from 07/03/2017. Alicia Keith tuna has been able to use AIMOVIG at 70 mg twice or what interval of 6 weeks. She has not seen a reduction in migraine frequency, intensity, and no change in migrainous character. We are discussing today to either switch to another CR BG medication or to give 140 mg dose a  trial. I opted to first try the 140 mg dosing, and if this does not give her success with in the next 3 weeks I will change her to use the TEVA CRBG.  She agrees with this plan. She plans a journey to [place] and we will meet after her travels. She continues to avoid wine and chocolate.     Interval history from 03/12/2017. Alicia Keith confirms that her migraine pattern has not changed, neither in frequency nor intensity. We are talking today about the enrollment into a monoclonal antibody therapy for migraine treatment, the drug is called AIMOVIG Dorise Hiss). Once a month. Leave the injector out of the refrigeration for 30 minutes and use subcutaneously in the thigh or abdomen, once monthly 70 mg or  140 mg in 2 ml.  She can get 4 pens in the trial. Nurse Victorino Dike has instructed her.    Alicia Keith describes a almost lifelong history of migraines. She never experienced auras, she never experienced one-sided headaches and for this reason presumed that she did not have migraine headaches. Her headaches would classified originally as tension headaches and treated as such. It was after she got married that she was diagnosed with migraine headaches and Imitrex was used for therapy, with some success. The headaches  begun as a teenager around age 79 and for related to her menstrual cycle. 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 This has included antidepressants, and antiepileptic. The latest medication treatment is by Lexapro and topiramate. She has tried to [employer] device- she feels some relief from that.  Most intense are her headache in the afternoon but she does frequently wake up with them. Headaches also wake her from sleep at night. These are pounding and felt strongest at the for head and the nape of the neck. She has seen Dr. Neale Burly by referral of Dr. Felicity Coyer the past, the headache was associated with  nausea the time photophobia, phonophobia and the symptoms increased if she moved. Dizziness was often present or at least lightheadedness. She really rarely had to vomit. She does not report visual aura, olfactory aura. Triggers that she has identified a bright light and weather changes. Not every headache every day is a migraine Dr. Neale Burly also obtained a headache impact test she scored 63 points- over 60 is considered severe impairment. I would like to list. Medications that have been tried and have failed, these have included amitriptyline, Cymbalta, visit for mean, doxepin, Effexor, imipramine, Lexapro, nortriptyline, Prozac and Vivactil. She has undergone procedural therapies including Botox, Nerve blocks and trigger point injections these have not helped at all. She has tried biofeedback without success, she attributes an increase in bruxism activity to Lexapro. She has had multiple MRIs over the years and apparently have never shown an abnormality. She has thus far not had any option to try CG RP medications. These are going to be introduced later this year.  Chief complaint according to patient : Headaches, migraines, daily photophobia and phonophobia. Bruxism. Sleep habits are as follows: Her usual bedtime is around 10 PM, will take her up to an hour to go to sleep. She does not watch TV in bed. Her husband has rather restless sleep she reports. He snores, she does not. The patient reports that she usually cannot sleep through the night and wakes up 4 maybe 5 times each night. She has resorted to listen to audiobooks and this way feels that it is easier to reenter sleep. He does not look at the clock when she wakes up at night, she usually stays in bed. She has at least one time nocturia at night. She rises usually at 8 AM, she is spontaneously awake at this time, after 6 or 7 hours of nocturnal sleep. She does not take daytime naps.  Medical history and family  history: The patient's mother also  struggles with insomnia and frequent sleep arousals and also listens to audiobooks. Her daughter seems not to have sleep problems. The patient's sister was involved in a motor vehicle accident which resulted in traumatic brain injury and intractable headaches of migrainous character. These were not present prior to the accident. Her mother has migraines as well. Maternal grandmother had migraines. Paternal uncle gets migraines .   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  She likes to garden and to go to [employer].  The patient is not using tobacco products, she drinks occasionally a glass of wine on weekends, she drinks decaffeinated coffee. He does not drink sodas or iced tea.  Review of Systems: Out of a complete 14 system review, the patient complains of only the following symptoms, and all other reviewed systems are negative.   Social History   Socioeconomic History  . Marital  status: Married    Spouse name: Not on file  . Number of children: 1  . Years of education: 6117  . Highest education level: Not on file  Occupational History  . Occupation: Retired    Comment: PT  Social Needs  . Financial resource strain: Not on file  . Food insecurity:    Worry: Not on file    Inability: Not on file  . Transportation needs:    Medical: Not on file    Non-medical: Not on file  Tobacco Use  . Smoking status: Never Smoker  . Smokeless tobacco: Never Used  . Tobacco comment: married, retired PT  Substance and Sexual Activity  . Alcohol use: Yes    Comment: "Couple glasses of wine"  . Drug use: No  . Sexual activity: Not on file  Lifestyle  . Physical activity:    Days per week: Not on file    Minutes per session: Not on file  . Stress: Not on file  Relationships  . Social connections:    Talks on phone: Not on file    Gets together: Not on file    Attends religious service: Not on file    Active member of club or organization: Not on file    Attends  meetings of clubs or organizations: Not on file    Relationship status: Not on file  . Intimate partner violence:    Fear of current or ex partner: Not on file    Emotionally abused: Not on file    Physically abused: Not on file    Forced sexual activity: Not on file  Other Topics Concern  . Not on file  Social History Narrative   8 hours of sleep per night   Lives with Dr. 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 Hyperlipidemia Mother   . Renal cancer Mother   . Hypertension Father   . Stroke Father     Past Medical History:  Diagnosis Date  . Allergic rhinitis, cause unspecified   . Headaches, cluster   . History of chicken pox   . Migraine headache     Past Surgical History:  Procedure Laterality Date  . TONSILLECTOMY  1955    Current Outpatient Medications  Medication Sig Dispense Refill  . AJOVY 225 MG/1.5ML SOSY INJECT 1.5 ML INTO THE SKIN EVERY 30 DAYS 1.5 mL 5  . baclofen (LIORESAL) 10 MG tablet Take 1 tablet (10 mg total) by mouth daily as needed for muscle spasms. 90 each 1  . Estradiol (YUVAFEM) 10 MCG TABS vaginal tablet Place vaginally. Using twice weekly    . fluticasone (FLONASE) 50 MCG/ACT nasal spray instill 2 sprays into each nostril once daily 16 g 0  . ibandronate (BONIVA) 150 MG tablet Take 150 mg by mouth every 30 (thirty) days. Take in the morning with a full glass of water, on an empty stomach, and do not take anything else by mouth or lie down for the next 30 min.    . rizatriptan (MAXALT) 10 MG tablet Take 1 tablet (10 mg total) by mouth as needed for migraine. May repeat in 2 hours if needed 10 tablet 5  . sertraline (ZOLOFT) 25 MG tablet Take 1 tablet (25 mg total) by mouth daily. 90 tablet 3  . topiramate (TOPAMAX) 100 MG tablet TAKE 1 TABLET (100 MG TOTAL) BY MOUTH DAILY. 90 tablet 2  No current facility-administered medications for this visit.     Allergies as of  06/09/2018 - Review Complete 06/09/2018  Allergen Reaction Noted  . Imipramine Rash 04/24/2016    Vitals: BP 102/70   Pulse 97   Ht 5' 0.5" (1.537 m)   Wt 114 lb (51.7 kg)   BMI 21.90 kg/m  Last Weight:  Wt Readings from Last 1 Encounters:  06/09/18 114 lb (51.7 kg)   DGU:YQIH mass index is 21.9 kg/m.     Last Height:   Ht Readings from Last 1 Encounters:  06/09/18 5' 0.5" (1.537 m)    Physical exam:  General: The patient is awake, alert and appears not in acute distress. The patient is well groomed. Head: Normocephalic, atraumatic. Neck is supple. Mallampati 3,  neck circumference:13.0. Nasal airflow; congestion TMJ is evident. Retrognathia is seen.  Cardiovascular:  Regular rate and rhythm, without  murmurs or carotid bruit, and without distended neck veins. Respiratory: Lungs are clear to auscultation. Skin:  Without evidence of edema, or rash Trunk: BMI is 22.6 Neurologic exam : The patient is awake and alert, oriented to place and time.   Memory subjective described as intact. Attention span & concentration ability appears normal.  Speech is fluent, without dysarthria, dysphonia or aphasia.  Mood and affect are appropriate.  Cranial nerves:Pupils are equal and briskly reactive to light.  Visual fields by finger perimetry are intact. Hearing to finger rub intact tinnitus. Facial sensation intact to fine touch.  Facial motor strength is symmetric. Shoulder shrug was symmetrical.   Normal tone and mass, symmetric DTR, stable gait and turns.   I spent more than 15  minutes of face to face time with the patient.  This was dedicated to follow up on AIMOVIG migraine prevention.  Greater than 80% of time was spent in counseling and coordination of care.  We have discussed the diagnosis and differential and I answered the patient's questions.     Assessment:  After physical and neurologic examination, review of laboratory studies,  Personal review of imaging studies,  reports of other /same  Imaging studies ,  Results of polysomnography/ neurophysiology testing and pre-existing records as far as provided in visit., my assessment is   1) Alicia Keith suffered from a migrainous headaches for many, many  years but the headache has also transformed in a chronic almost daily migraine. Traditional medications used to prevent or treat headaches have had less and less effect, and injection therapies nerve blocks Botox etc. have not had lasting effect at all. I think she was an excellent candidate for the new protein binder CGRP.  08-06-2017 third dose sample if given for at home use.  Last dose was the 7th-  She is now continuing on AJOVY.  06-09-2018 .   She can continue using a triptan to abbreviate abort headaches. NSAIDS are allowed, too.     Alicia Mylar Rosealynn Mateus MD  06/09/2018   CC: Madelin Headings, Md [address] [place], [place] [address]

## 2018-07-02 ENCOUNTER — Telehealth: Payer: Self-pay | Admitting: Neurology

## 2018-07-02 NOTE — Telephone Encounter (Signed)
PA submitted through cover my meds/ [employer].  Can take up to 5 days before hearing a response [id number]

## 2018-07-02 NOTE — Telephone Encounter (Signed)
PA approved for the patient from [employer] from 07/01/18-07/01/2019 REF # [id number]

## 2018-07-03 ENCOUNTER — Encounter: Payer: Self-pay | Admitting: Neurology

## 2018-07-12 ENCOUNTER — Encounter: Payer: Self-pay | Admitting: Neurology

## 2018-07-14 ENCOUNTER — Other Ambulatory Visit: Payer: Self-pay | Admitting: Neurology

## 2018-07-14 MED ORDER — FREMANEZUMAB-VFRM 225 MG/1.5ML ~~LOC~~ SOSY: 1.5000 mL | PREFILLED_SYRINGE | SUBCUTANEOUS | 11 refills | Status: DC

## 2018-07-23 MED FILL — AJOVY 225 MG/1.5ML SOSY: 225 | 30 days supply | Qty: 2 | Fill #0

## 2018-08-20 MED FILL — AJOVY 225 MG/1.5ML SOSY: 225 | 30 days supply | Qty: 2 | Fill #1

## 2018-09-03 DIAGNOSIS — Z0100 Encounter for examination of eyes and vision without abnormal findings: Secondary | ICD-10-CM | POA: Diagnosis not present

## 2018-09-22 MED FILL — AJOVY 225 MG/1.5ML SOSY: 225 | 30 days supply | Qty: 2 | Fill #2

## 2018-10-31 MED FILL — AJOVY 225 MG/1.5ML SOSY: 225 | 30 days supply | Qty: 2 | Fill #3

## 2018-11-08 ENCOUNTER — Other Ambulatory Visit: Payer: Self-pay | Admitting: Neurology

## 2018-11-22 ENCOUNTER — Other Ambulatory Visit: Payer: Self-pay | Admitting: Neurology

## 2018-11-28 MED FILL — AJOVY 225 MG/1.5ML SOSY: 225 | 30 days supply | Qty: 2 | Fill #4 | Status: TO

## 2019-01-09 MED FILL — AJOVY 225 MG/1.5ML SOSY: 225 | 30 days supply | Qty: 2 | Fill #0

## 2019-02-10 MED FILL — AJOVY 225 MG/1.5ML SOSY: 225 | 30 days supply | Qty: 2 | Fill #1

## 2019-05-26 ENCOUNTER — Telehealth (INDEPENDENT_AMBULATORY_CARE_PROVIDER_SITE_OTHER): Payer: Medicare Other | Admitting: Internal Medicine

## 2019-05-26 ENCOUNTER — Other Ambulatory Visit: Payer: Self-pay

## 2019-05-26 ENCOUNTER — Encounter: Payer: Self-pay | Admitting: Internal Medicine

## 2019-05-26 ENCOUNTER — Telehealth: Payer: Self-pay

## 2019-05-26 DIAGNOSIS — R233 Spontaneous ecchymoses: Secondary | ICD-10-CM

## 2019-05-26 DIAGNOSIS — R238 Other skin changes: Secondary | ICD-10-CM

## 2019-05-26 DIAGNOSIS — D696 Thrombocytopenia, unspecified: Secondary | ICD-10-CM

## 2019-05-26 DIAGNOSIS — L959 Vasculitis limited to the skin, unspecified: Secondary | ICD-10-CM

## 2019-05-26 DIAGNOSIS — D692 Other nonthrombocytopenic purpura: Secondary | ICD-10-CM

## 2019-05-26 NOTE — Telephone Encounter (Signed)
Pt scehduled for virtual   Copied from [place] [phone]. Topic: General - Other >> May 26, 2019 11:19 AM Leward Quan A wrote: Reason for CRM: Patient called to request a call back from Dr Regis Bill nurse states that it is reference to a medical problem no other explanation. Just that say she need to talk to the nurse. Can be reached at Ph# [phone]

## 2019-05-26 NOTE — Progress Notes (Signed)
Virtual Visit via Video Note  I connected with@ on 05/26/19 at  3:30 PM EDT by a video enabled telemedicine application and verified that I am speaking with the correct person using two identifiers. Location patient: home Location provider:work office Persons participating in the virtual visit: patient, provider  [place] national recommendations  regarding COVID 19 pandemic   video visit is advised over in office visit for this patient.  Patient aware  of the limitations of evaluation and management by telemedicine and  availability of in person appointments. and agreed to proceed.   HPI: Alicia Keith presents for video visit with husband also  Onset about a month ago of  Mosquito bites that itch and turn into hemorragic  Rash  And on going for a month  Spouse retired hematologist feels petechial and small vessel vasculotis. She has hx of easy bruising but not bleeding  No asa  Takes ibuprofen 400 per day .  No new meds   Rash in distal LE  Below knee and  Both but right more than left .  NO systemic sx such as fever weight loss  abelt to do her normal activity and exercise. NO hx of illness predating? She is under care for migraines  ? Stable. Per Dr Marylou Flesherohmier Colvin CaroliStil taking sertraline   No changes   ROS: See pertinent positives and negatives per HPI. See hpi  No cough sob   Past Medical History:  Diagnosis Date  . Allergic rhinitis, cause unspecified   . Headaches, cluster   . History of chicken pox   . Migraine headache     Past Surgical History:  Procedure Laterality Date  . TONSILLECTOMY  1955    Family History  Problem Relation Age of Onset  . Hyperlipidemia Mother   . Renal cancer Mother   . Hypertension Father   . Stroke Father     Social History   Tobacco Use  . Smoking status: Never Smoker  . Smokeless tobacco: Never Used  . Tobacco comment: married, retired PT  Substance Use Topics  . Alcohol use: Yes    Comment: "Couple glasses of wine"  . Drug use: No       Current Outpatient Medications:  .  baclofen (LIORESAL) 10 MG tablet, TAKE 1 TABLET BY MOUTH DAILY AS NEEDED FOR MUSCLE SPASMS., Disp: 90 each, Rfl: 1 .  Estradiol (YUVAFEM) 10 MCG TABS vaginal tablet, Place vaginally. Using twice weekly, Disp: , Rfl:  .  fluticasone (FLONASE) 50 MCG/ACT nasal spray, instill 2 sprays into each nostril once daily, Disp: 16 g, Rfl: 0 .  Fremanezumab-vfrm (AJOVY) 225 MG/1.5ML SOSY, Inject 1.5 mLs into the skin every 30 (thirty) days., Disp: 1.5 mL, Rfl: 11 .  ibandronate (BONIVA) 150 MG tablet, Take 150 mg by mouth every 30 (thirty) days. Take in the morning with a full glass of water, on an empty stomach, and do not take anything else by mouth or lie down for the next 30 min., Disp: , Rfl:  .  rizatriptan (MAXALT) 10 MG tablet, TAKE 1 TABLET BY MOUTH AS NEEDED FOR MIGRAINE. MAY REPEAT IN 2 HOURS IF NEEDED, Disp: 12 tablet, Rfl: 5 .  sertraline (ZOLOFT) 25 MG tablet, TAKE 1 TABLET BY MOUTH ONCE A DAY, Disp: 90 tablet, Rfl: 3 .  topiramate (TOPAMAX) 100 MG tablet, TAKE 1 TABLET (100 MG TOTAL) BY MOUTH DAILY., Disp: 90 tablet, Rfl: 2  EXAM: BP Readings from Last 3 Encounters:  06/09/18 102/70  03/26/18 105/78  12/04/17  97/69    VITALS per patient if applicable: appears well   GENERAL: alert, oriented, appears well and in no acute distress  HEENT: atraumatic, conjunttiva clear, no obvious abnormalities on inspection of external nose and ears  NECK: normal movements of the head and neck LUNGS: on inspection no signs of respiratory distress, breathing rate appears normal, no obvious gross SOB, gasping or wheezing CV: no obvious cyanosis MS: moves all visible extremities without noticeable abnormality  Cell phone pix  Spouse took looks like petechial and central spot distal extremities   he says is palp purpura.  No ulceration  To send pictures   PSYCH/NEURO: pleasant and cooperative, no obvious depression or anxiety, speech and thought processing  grossly intact Lab Results  Component Value Date   WBC 7.1 03/26/2018   HGB 12.3 03/26/2018   HCT 37.2 03/26/2018   PLT 134.0 (L) 03/26/2018   GLUCOSE 90 03/26/2018   CHOL 241 (H) 03/26/2018   TRIG 152.0 (H) 03/26/2018   HDL 72.90 03/26/2018   LDLCALC 137 (H) 03/26/2018   ALT 15 03/26/2018   AST 19 03/26/2018   NA 138 03/26/2018   K 4.3 03/26/2018   CL 102 03/26/2018   CREATININE 0.86 03/26/2018   BUN 25 (H) 03/26/2018   CO2 27 03/26/2018   TSH 1.46 03/26/2018    ASSESSMENT AND PLAN:  Discussed the following assessment and plan:    ICD-10-CM   1. Petechial rash  R23.3 APTT    Basic metabolic panel    CBC with Differential/Platelet    C-reactive protein    Hepatic function panel    POCT Urinalysis Dipstick (Automated)    Protime-INR    Sedimentation rate    TSH    CANCELED: CBC with Differential/Platelet    CANCELED: Basic metabolic panel    CANCELED: Hepatic function panel    CANCELED: TSH    CANCELED: POCT Urinalysis Dipstick (Automated)    CANCELED: Protime-INR    CANCELED: APTT    CANCELED: Sedimentation rate    CANCELED: C-reactive protein  2. Thrombocytopenia (HCC)  D69.6 APTT    Basic metabolic panel    CBC with Differential/Platelet    C-reactive protein    Hepatic function panel    POCT Urinalysis Dipstick (Automated)    Protime-INR    Sedimentation rate    TSH    CANCELED: CBC with Differential/Platelet    CANCELED: Basic metabolic panel    CANCELED: Hepatic function panel    CANCELED: TSH    CANCELED: POCT Urinalysis Dipstick (Automated)    CANCELED: Protime-INR    CANCELED: APTT    CANCELED: Sedimentation rate    CANCELED: C-reactive protein  3. Easy bruising  R23.8 APTT    Basic metabolic panel    CBC with Differential/Platelet    C-reactive protein    Hepatic function panel    POCT Urinalysis Dipstick (Automated)    Protime-INR    Sedimentation rate    TSH    CANCELED: CBC with Differential/Platelet    CANCELED: Basic metabolic  panel    CANCELED: Hepatic function panel    CANCELED: TSH    CANCELED: POCT Urinalysis Dipstick (Automated)    CANCELED: Protime-INR    CANCELED: APTT    CANCELED: Sedimentation rate    CANCELED: C-reactive protein  4. 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    Basic metabolic panel    CBC with Differential/Platelet    C-reactive protein    Hepatic function panel    POCT Urinalysis Dipstick (Automated)    Protime-INR    Sedimentation rate    TSH    CANCELED: CBC with Differential/Platelet    CANCELED: Basic metabolic panel    CANCELED: Hepatic function panel    CANCELED: TSH    CANCELED: POCT Urinalysis Dipstick (Automated)    CANCELED: Protime-INR    CANCELED: APTT    CANCELED: Sedimentation rate    CANCELED: C-reactive protein   Has hs of lower sided platelets   consideration of itp   Other  She is well otherwise  Plan lab this week and in person visit next week   Send in pictures  Counseled.   Expectant management and discussion of plan and treatment with opportunity to ask questions and all were answered. The patient agreed with the plan and demonstrated an understanding of the instructions.   Advised to call back or seek an in-person evaluation if worsening  or having  further concerns .     Shanon Ace, MD

## 2019-05-28 ENCOUNTER — Other Ambulatory Visit: Payer: Self-pay

## 2019-05-29 ENCOUNTER — Other Ambulatory Visit (INDEPENDENT_AMBULATORY_CARE_PROVIDER_SITE_OTHER): Payer: Medicare Other

## 2019-05-29 ENCOUNTER — Ambulatory Visit: Payer: 59

## 2019-05-29 DIAGNOSIS — D692 Other nonthrombocytopenic purpura: Secondary | ICD-10-CM | POA: Diagnosis not present

## 2019-05-29 DIAGNOSIS — R238 Other skin changes: Secondary | ICD-10-CM

## 2019-05-29 DIAGNOSIS — L959 Vasculitis limited to the skin, unspecified: Secondary | ICD-10-CM

## 2019-05-29 DIAGNOSIS — R233 Spontaneous ecchymoses: Secondary | ICD-10-CM | POA: Diagnosis not present

## 2019-05-29 DIAGNOSIS — D696 Thrombocytopenia, unspecified: Secondary | ICD-10-CM

## 2019-05-29 LAB — HEPATIC FUNCTION PANEL
ALT: 12 U/L (ref 0–35)
AST: 13 U/L (ref 0–37)
Albumin: 3.9 g/dL (ref 3.5–5.2)
Alkaline Phosphatase: 51 U/L (ref 39–117)
Bilirubin, Direct: 0 mg/dL (ref 0.0–0.3)
Total Bilirubin: 0.3 mg/dL (ref 0.2–1.2)
Total Protein: 6.3 g/dL (ref 6.0–8.3)

## 2019-05-29 LAB — CBC WITH DIFFERENTIAL/PLATELET
Basophils Absolute: 0.1 10*3/uL (ref 0.0–0.1)
Basophils Relative: 1.1 % (ref 0.0–3.0)
Eosinophils Absolute: 0.2 10*3/uL (ref 0.0–0.7)
Eosinophils Relative: 3.9 % (ref 0.0–5.0)
HCT: 36.7 % (ref 36.0–46.0)
Hemoglobin: 12.1 g/dL (ref 12.0–15.0)
Lymphocytes Relative: 25.9 % (ref 12.0–46.0)
Lymphs Abs: 1.5 10*3/uL (ref 0.7–4.0)
MCHC: 32.9 g/dL (ref 30.0–36.0)
MCV: 85.2 fl (ref 78.0–100.0)
Monocytes Absolute: 0.5 10*3/uL (ref 0.1–1.0)
Monocytes Relative: 8 % (ref 3.0–12.0)
Neutro Abs: 3.6 10*3/uL (ref 1.4–7.7)
Neutrophils Relative %: 61.1 % (ref 43.0–77.0)
Platelets: 134 10*3/uL — ABNORMAL LOW (ref 150.0–400.0)
RBC: 4.31 Mil/uL (ref 3.87–5.11)
RDW: 13.9 % (ref 11.5–15.5)
WBC: 5.9 10*3/uL (ref 4.0–10.5)

## 2019-05-29 LAB — BASIC METABOLIC PANEL
BUN: 21 mg/dL (ref 6–23)
CO2: 31 mEq/L (ref 19–32)
Calcium: 8.9 mg/dL (ref 8.4–10.5)
Chloride: 105 mEq/L (ref 96–112)
Creatinine, Ser: 0.88 mg/dL (ref 0.40–1.20)
GFR: 63.52 mL/min (ref >60.00)
Glucose, Bld: 83 mg/dL (ref 70–99)
Potassium: 4.1 mEq/L (ref 3.5–5.1)
Sodium: 141 mEq/L (ref 135–145)

## 2019-05-29 LAB — APTT: aPTT: 29.8 s (ref 23.4–32.7)

## 2019-05-29 LAB — POC URINALSYSI DIPSTICK (AUTOMATED)
Bilirubin, UA: NEGATIVE
Blood, UA: NEGATIVE
Glucose, UA: NEGATIVE
Ketones, UA: NEGATIVE
Leukocytes, UA: NEGATIVE
Nitrite, UA: NEGATIVE
Protein, UA: NEGATIVE
Spec Grav, UA: 1.01 (ref 1.010–1.025)
Urobilinogen, UA: 0.2 E.U./dL
pH, UA: 8.5 — AB (ref 5.0–8.0)

## 2019-05-29 LAB — PROTIME-INR
INR: 1 ratio (ref 0.8–1.0)
Prothrombin Time: 11.2 s (ref 9.6–13.1)

## 2019-05-29 LAB — TSH: TSH: 2.5 u[IU]/mL (ref 0.35–4.50)

## 2019-05-29 LAB — SEDIMENTATION RATE: Sed Rate: 34 mm/hr — ABNORMAL HIGH (ref 0–30)

## 2019-05-29 LAB — C-REACTIVE PROTEIN: CRP: <1 mg/dL (ref 0.5–20.0)

## 2019-06-02 ENCOUNTER — Ambulatory Visit: Payer: 59

## 2019-06-02 NOTE — Progress Notes (Signed)
Chief Complaint  Patient presents with  . Rash    Follow up on rash  getting   better     HPI: Alicia Keith 68 y.o. come in for   eval of rash and lab and petechial reaction.  No new sx x one bite on knee area.   Seems to be fading  And not spreading  Had labs done and nl x  plt 134  Feels fine no fevers pre dated infection prodrome and no new meds . No unusual diet .  Only ocass use of  Shaving on legs  ROS: See pertinent positives and negatives per HPI.  Past Medical History:  Diagnosis Date  . Allergic rhinitis, cause unspecified   . Headaches, cluster   . History of chicken pox   . Migraine headache     Family History  Problem Relation Age of Onset  . Hyperlipidemia Mother   . Renal cancer Mother   . Hypertension Father   . Stroke Father     Social History   Socioeconomic History  . Marital status: Married    Spouse name: Not on file  . Number of children: 1  . Years of education: 7  . Highest education level: Not on file  Occupational History  . Occupation: Retired    Comment: PT  Social Needs  . Financial resource strain: Not on file  . Food insecurity    Worry: Not on file    Inability: Not on file  . Transportation needs    Medical: Not on file    Non-medical: Not on file  Tobacco Use  . Smoking status: Never Smoker  . Smokeless tobacco: Never Used  . Tobacco comment: married, retired PT  Substance and Sexual Activity  . Alcohol use: Yes    Comment: "Couple glasses of wine"  . Drug use: No  . Sexual activity: Not on file  Lifestyle  . Physical activity    Days per week: Not on file    Minutes per session: Not on file  . Stress: Not on file  Relationships  . Social Musician on phone: Not on file    Gets together: Not on file    Attends religious service: Not on file    Active member of club or organization: Not on file    Attends meetings of clubs or organizations: Not on file    Relationship status: Not on file   Other Topics Concern  . Not on file  Social History Narrative   8 hours of sleep per night   Lives with Dr. 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 baclofen (LIORESAL) 10 MG tablet TAKE 1 TABLET BY MOUTH DAILY AS NEEDED FOR MUSCLE SPASMS. 90 each 1  . Estradiol (YUVAFEM) 10 MCG TABS vaginal tablet Place vaginally. Using twice weekly    . fluticasone (FLONASE) 50 MCG/ACT nasal spray instill 2 sprays into each nostril once daily 16 g 0  . ibandronate (BONIVA) 150 MG tablet Take 150 mg by mouth every 30 (thirty) days. Take in the morning with a full glass of water, on an empty stomach, and do not take anything else by mouth or lie down for the next 30 min.    . rizatriptan (MAXALT) 10 MG tablet TAKE 1 TABLET BY MOUTH AS NEEDED FOR MIGRAINE. MAY REPEAT IN 2 HOURS IF  NEEDED 12 tablet 5  . sertraline (ZOLOFT) 25 MG tablet TAKE 1 TABLET BY MOUTH ONCE A DAY 90 tablet 3  . topiramate (TOPAMAX) 100 MG tablet TAKE 1 TABLET (100 MG TOTAL) BY MOUTH DAILY. 90 tablet 2  . Fremanezumab-vfrm (AJOVY) 225 MG/1.5ML SOSY Inject 1.5 mLs into the skin every 30 (thirty) days. 1.5 mL 11   No facility-administered medications prior to visit.      EXAM:  BP 112/60 (BP Location: Right Arm, Patient Position: Sitting, Cuff Size: Normal)   Pulse 97   Temp 97.8 F (36.6 C) (Temporal)   Ht 5' 0.5" (1.537 m)   Wt 120 lb 3.2 oz (54.5 kg)   SpO2 97%   BMI 23.09 kg/m   Body mass index is 23.09 kg/m.  GENERAL: vitals reviewed and listed above, alert, oriented, appears well hydrated and in no acute distress HEENT: atraumatic, conjunctiva  clear, no obvious abnormalities on inspection of external nose and ears tm clear eye s appear clear OP : masked  NECK: no obvious masses on inspection palpation  LUNGS: clear to auscultation bilaterally, no wheezes, rales or rhonchi, good air movement CV: HRRR, no clubbing cyanosis  or  peripheral edema nl cap refill  Abdomen:  Sof,t normal bowel sounds without hepatosplenomegaly, no guarding rebound or masses no CVA tenderness  MS: moves all extremities without noticeable focal  Abnormality Skin  Fading dark ireg shaped "bites" no dc abscess  And  Fading petechial looking  Scattered area on  Lower 1/2 of shin and one posterior there are no other  Rashes  abd back neck face  Arms  And feet .   No bruising whatsoever  And   No lymphadenopathy  Noted  PSYCH: pleasant and cooperative, no obvious depression or anxiety Lab Results  Component Value Date   WBC 5.9 05/29/2019   HGB 12.1 05/29/2019   HCT 36.7 05/29/2019   PLT 134.0 (L) 05/29/2019   GLUCOSE 83 05/29/2019   CHOL 241 (H) 03/26/2018   TRIG 152.0 (H) 03/26/2018   HDL 72.90 03/26/2018   LDLCALC 137 (H) 03/26/2018   ALT 12 05/29/2019   AST 13 05/29/2019   NA 141 05/29/2019   K 4.1 05/29/2019   CL 105 05/29/2019   CREATININE 0.88 05/29/2019   BUN 21 05/29/2019   CO2 31 05/29/2019   TSH 2.50 05/29/2019   INR 1.0 05/29/2019   BP Readings from Last 3 Encounters:  06/03/19 112/60  06/09/18 102/70  03/26/18 105/78    ASSESSMENT AND PLAN:  Discussed the following assessment and plan:  Petechial rash le resolving - fading near "bite" papules  non palpable rash and no crusting  Thrombocytopenia (HCC) mild - 134 - Plan: CBC with Differential/Platelet, CANCELED: Basic metabolic panel  Need for influenza vaccination - Plan: Flu Vaccine QUAD High Dose(Fluad) Her exam is reassuringly nl x rash that appears to be resolving  And no evidence of  Other disease process    Plan to follow  And  Repeat   cb c diff in 3 mos or earlier if needed . Marland Kitchen Assuming resolution and  No new sx rash etc .  Send  in message if  Not as expected  And or new sx rash etc   Can consider  bx but this appears to be a reactive process that should  Resolve  onits own  -Patient advised to return or notify health care team  if  new concerns  arise.  Patient Instructions  Your exam is  good  Today and the rash seems to be fading  Your blood work  is good   Except. mild low platelet count  ,  That we can follow. Plan repeat  Blood work   In 3 months or as needed. If rash continues to fade and  No   persistent or progressive   Then  We can just do labs .  If  Rash is  persistent or progressive   Let me know.   Neta MendsWanda K. Panosh M.D.

## 2019-06-03 ENCOUNTER — Encounter: Payer: Self-pay | Admitting: Internal Medicine

## 2019-06-03 ENCOUNTER — Ambulatory Visit (INDEPENDENT_AMBULATORY_CARE_PROVIDER_SITE_OTHER): Payer: Medicare Other | Admitting: Internal Medicine

## 2019-06-03 ENCOUNTER — Other Ambulatory Visit: Payer: Self-pay

## 2019-06-03 VITALS — BP 112/60 | HR 97 | Temp 97.8°F | Ht 60.5 in | Wt 120.2 lb

## 2019-06-03 DIAGNOSIS — R233 Spontaneous ecchymoses: Secondary | ICD-10-CM

## 2019-06-03 DIAGNOSIS — Z23 Encounter for immunization: Secondary | ICD-10-CM

## 2019-06-03 DIAGNOSIS — D696 Thrombocytopenia, unspecified: Secondary | ICD-10-CM | POA: Diagnosis not present

## 2019-06-03 NOTE — Patient Instructions (Addendum)
Your exam is good  Today and the rash seems to be fading  Your blood work  is good   Except. mild low platelet count  ,  That we can follow. Plan repeat  Blood work   In 3 months or as needed. If rash continues to fade and  No   persistent or progressive   Then  We can just do labs .  If  Rash is  persistent or progressive   Let me know.

## 2019-06-23 ENCOUNTER — Telehealth: Payer: Self-pay | Admitting: Neurology

## 2019-06-23 NOTE — Telephone Encounter (Signed)
PA completed for the pt on cover my meds/medimpact.  NLG:[id number] Will wait for response

## 2019-06-25 ENCOUNTER — Encounter: Payer: Self-pay | Admitting: Neurology

## 2019-06-29 ENCOUNTER — Other Ambulatory Visit: Payer: Self-pay | Admitting: Neurology

## 2019-08-19 ENCOUNTER — Other Ambulatory Visit: Payer: Medicare Other

## 2019-08-19 LAB — HM MAMMOGRAPHY

## 2019-08-31 ENCOUNTER — Encounter: Payer: Self-pay | Admitting: Internal Medicine

## 2019-10-21 ENCOUNTER — Encounter: Payer: Self-pay | Admitting: Neurology

## 2019-10-21 ENCOUNTER — Telehealth: Payer: Self-pay | Admitting: Neurology

## 2019-10-21 NOTE — Telephone Encounter (Signed)
Phone rep tried calling pt to schedule a f/u with NP for a follow up. Each time a call was made pt's line rang busy.

## 2019-10-21 NOTE — Telephone Encounter (Signed)
Pt has called for a refill on her  topiramate (TOPAMAX) 100 MG tablet [id number] Pt's new insurance information [facility] Member YF#[id number] Issuer:[id number] RX RFF#[id number] RX PCN#[id number] RX [id number] Card Issue date 08-25-2019

## 2019-10-21 NOTE — Telephone Encounter (Signed)
This encounter was created in error - please disregard.

## 2019-10-21 NOTE — Telephone Encounter (Signed)
Send patient a mychart message to advise her to call and schedule apt.

## 2019-10-25 ENCOUNTER — Ambulatory Visit: Payer: Medicare Other

## 2019-10-31 ENCOUNTER — Ambulatory Visit: Payer: Medicare Other | Attending: Internal Medicine

## 2019-10-31 DIAGNOSIS — Z23 Encounter for immunization: Secondary | ICD-10-CM

## 2019-10-31 NOTE — Progress Notes (Signed)
   Covid-19 Vaccination Clinic  Name:  Alicia Keith    MRN: [id number] DOB: Aug 24, 1951  10/31/2019  [name] was observed post Covid-19 immunization for 15 minutes without incidence. She was provided with Vaccine Information Sheet and instruction to access the V-Safe system.   [name] was instructed to call 911 with any severe reactions post vaccine: Marland Kitchen Difficulty breathing  . Swelling of your face and throat  . A fast heartbeat  . A bad rash all over your body  . Dizziness and weakness    Immunizations Administered    Name Date Dose VIS Date Route   Pfizer COVID-19 Vaccine 10/31/2019  8:46 AM 0.3 mL 08/28/2019 Intramuscular   Manufacturer: [employer], [employer]   Lot: [id number]   NDC: [address]-2

## 2019-11-04 ENCOUNTER — Encounter: Payer: Self-pay | Admitting: Neurology

## 2019-11-04 ENCOUNTER — Other Ambulatory Visit: Payer: Self-pay

## 2019-11-04 ENCOUNTER — Ambulatory Visit (INDEPENDENT_AMBULATORY_CARE_PROVIDER_SITE_OTHER): Payer: Medicare Other | Admitting: Neurology

## 2019-11-04 VITALS — BP 111/80 | HR 87 | Temp 97.3°F | Ht 60.5 in | Wt 121.0 lb

## 2019-11-04 DIAGNOSIS — G43711 Chronic migraine without aura, intractable, with status migrainosus: Secondary | ICD-10-CM | POA: Diagnosis not present

## 2019-11-04 DIAGNOSIS — G43011 Migraine without aura, intractable, with status migrainosus: Secondary | ICD-10-CM

## 2019-11-04 DIAGNOSIS — J3089 Other allergic rhinitis: Secondary | ICD-10-CM

## 2019-11-04 DIAGNOSIS — I700 Atherosclerosis of aorta: Secondary | ICD-10-CM | POA: Diagnosis not present

## 2019-11-04 DIAGNOSIS — D696 Thrombocytopenia, unspecified: Secondary | ICD-10-CM | POA: Insufficient documentation

## 2019-11-04 MED ORDER — FLUOXETINE HCL 10 MG PO CAPS: 20.0000 mg | ORAL_CAPSULE | Freq: Every day | ORAL | 3 refills | Status: DC

## 2019-11-04 MED ORDER — UBRELVY 100 MG PO TABS: 100.0000 mg | ORAL_TABLET | ORAL | 2 refills | Status: DC | PRN

## 2019-11-04 MED ORDER — TOPIRAMATE 50 MG PO TABS: 50.0000 mg | ORAL_TABLET | Freq: Two times a day (BID) | ORAL | 3 refills | Status: DC

## 2019-11-04 NOTE — Patient Instructions (Addendum)
Ubrogepant tablets What is this medicine? UBROGEPANT (ue BROE je pant) is used to treat migraine headaches with or without aura. An aura is a strange feeling or visual disturbance that warns you of an attack. It is not used to prevent migraines. This medicine may be used for other purposes; ask your health care provider or pharmacist if you have questions. COMMON BRAND NAME(S): Bernita Raisin What should I tell my health care provider before I take this medicine? They need to know if you have any of these conditions:  kidney disease  liver disease  an unusual or allergic reaction to ubrogepant, other medicines, foods, dyes, or preservatives  pregnant or trying to get pregnant  breast-feeding How should I use this medicine? Take this medicine by mouth with a glass of water. Follow the directions on the prescription label. You can take it with or without food. If it upsets your stomach, take it with food. Take your medicine at regular intervals. Do not take it more often than directed. Do not stop taking except on your doctor's advice. Talk to your pediatrician about the use of this medicine in children. Special care may be needed. Overdosage: If you think you have taken too much of this medicine contact a poison control center or emergency room at once. NOTE: This medicine is only for you. Do not share this medicine with others. What if I miss a dose? This does not apply. This medicine is not for regular use. What may interact with this medicine? Do not take this medicine with any of the following medicines:  ceritinib Fluoxetine capsules or tablets (PMDD indication) What is this medicine? FLUOXETINE (floo OX e teen) belongs to a class of drugs known as selective serotonin reuptake inhibitors (SSRIs). It is used for premenstrual dysphoric disorder (PMDD). PMDD causes intense mood and physical symptoms a week or two before your period every month. This drug helps improve mood swings, tiredness,  tension, and breast tenderness. This medicine may be used for other purposes; ask your health care provider or pharmacist if you have questions. COMMON BRAND NAME(S): Prozac, Sarafem, Selfemra What should I tell my health care provider before I take this medicine? They need to know if you have any of these conditions: bipolar disorder or a family history of bipolar disorder bleeding disorders glaucoma heart disease liver disease low levels of sodium in the blood seizures suicidal thoughts, plans, or attempt; a previous suicide attempt by you or a family member take MAOIs like Carbex, Eldepryl, Marplan, Nardil, and Parnate take medicines that treat or prevent blood clots thyroid disease an unusual or allergic reaction to fluoxetine, other medicines, foods, dyes, or preservatives pregnant or trying to get pregnant breast-feeding bipolar disorder or a family history of bipolar disorder bleeding disorders glaucoma heart disease liver disease low levels of sodium in the blood seizures suicidal thoughts, plans, or attempt; a previous suicide attempt by you or a family member take MAOIs like Carbex, Eldepryl, Marplan, Nardil, and Parnate take medicines that treat or prevent blood clots thyroid disease an unusual or allergic reaction to fluoxetine, other medicines, foods, dyes, or preservatives pregnant or trying to get pregnant breast-feeding How should I use this medicine? Take this medicine by mouth with a glass of water. Follow the directions on the prescription label. You can take it with or without food. Take your medicine at regular intervals. Do not take it more often than directed. Do not stop taking this medicine suddenly except upon the advice of your doctor. Stopping  this medicine too quickly may cause serious side effects or your condition may worsen. A special MedGuide will be given to you by the pharmacist with each prescription and refill. Be sure to read this information  carefully each time. Talk to your pediatrician regarding the use of this medicine in children. Special care may be needed. Overdosage: If you think you have taken too much of this medicine contact a poison control center or emergency room at once. NOTE: This medicine is only for you. Do not share this medicine with others. What if I miss a dose? If you miss a dose, skip the missed dose and go back to your regular dosing schedule. Do not take double or extra doses. What may interact with this medicine? Do not take this medicine with any of the following medications: other medicines containing fluoxetine, like Prozac or Symbyax cisapride dronedarone linezolid MAOIs like Carbex, Eldepryl, Marplan, Nardil, and Parnate methylene blue (injected into a vein) pimozide thioridazine This medicine may also interact with the following medications: alcohol amphetamines aspirin and aspirin-like medicines carbamazepine certain medicines for depression, anxiety, or psychotic disturbances certain medicines for migraine headaches like almotriptan, eletriptan, frovatriptan, naratriptan, rizatriptan, sumatriptan, zolmitriptan digoxin diuretics fentanyl flecainide furazolidone isoniazid lithium medicines for sleep medicines that treat or prevent blood clots like warfarin, enoxaparin, and dalteparin NSAIDs, medicines for pain and inflammation, like ibuprofen or naproxen other medicines that prolong the QT interval (an abnormal heart rhythm) phenytoin procarbazine propafenone rasagiline ritonavir supplements like St. John's wort, kava kava, valerian tramadol tryptophan vinblastine This list may not describe all possible interactions. Give your health care provider a list of all the medicines, herbs, non-prescription drugs, or dietary supplements you use. Also tell them if you smoke, drink alcohol, or use illegal drugs. Some items may interact with your medicine. What should I watch for while  using this medicine? Tell your doctor if your symptoms do not get better or if they get worse. Visit your doctor or health care professional for regular checks on your progress. Patients and their families should watch out for new or worsening thoughts of suicide or depression. Also watch out for sudden changes in feelings such as feeling anxious, agitated, panicky, irritable, hostile, aggressive, impulsive, severely restless, overly excited and hyperactive, or not being able to sleep. If this happens, especially at the beginning of treatment or after a change in dose, call your health care professional. Bonita Quin may get drowsy or dizzy. Do not drive, use machinery, or do anything that needs mental alertness until you know how this medicine affects you. Do not stand or sit up quickly, especially if you are an older patient. This reduces the risk of dizzy or fainting spells. Alcohol may interfere with the effect of this medicine. Avoid alcoholic drinks. Your mouth may get dry. Chewing sugarless gum or sucking hard candy, and drinking plenty of water may help. Contact your doctor if the problem does not go away or is severe. This medicine may affect blood sugar levels. If you have diabetes, check with your doctor or health care professional before you change your diet or the dose of your diabetic medicine. What side effects may I notice from receiving this medicine? Side effects that you should report to your doctor or health care professional as soon as possible: allergic reactions like skin rash, itching or hives, swelling of the face, lips, or tongue anxious black, tarry stools breathing problems changes in vision confusion elevated mood, decreased need for sleep, racing thoughts, impulsive behavior eye  pain fast, irregular heartbeat feeling faint or lightheaded, falls feeling agitated, angry, or irritable hallucination, loss of contact with reality loss of balance or coordination loss of  memory restlessness, pacing, inability to keep still seizures stiff muscles suicidal thoughts or other mood changes trouble sleeping unusual bleeding or bruising unusually weak or tired vomiting Side effects that usually do not require medical attention (report to your doctor or health care professional if they continue or are bothersome): change in appetite or weight change in sex drive or performance diarrhea dry mouth headache increased sweating indigestion, nausea tremors This list may not describe all possible side effects. Call your doctor for medical advice about side effects. You may report side effects to FDA at 1-800-FDA-1088. Where should I keep my medicine? Keep out of the reach of children. Store at room temperature between 15 and 30 degrees C (59 and 86 degrees F). Throw away any unused medicine after the expiration date. NOTE: This sheet is a summary. It may not cover all possible information. If you have questions about this medicine, talk to your doctor, pharmacist, or health care provider.  2020 Elsevier/Gold Standard (5366-44-03 47:42:59)  certain antibiotics like chloramphenicol, clarithromycin, telithromycin  certain antivirals for HIV like atazanavir, cobicistat, darunavir, delavirdine, fosamprenavir, indinavir, ritonavir  certain medicines for fungal infections like itraconazole, ketoconazole, posaconazole, voriconazole  conivaptan  grapefruit  idelalisib  mifepristone  nefazodone  ribociclib This medicine may also interact with the following medications:  carvedilol  certain medicines for seizures like phenobarbital, phenytoin  ciprofloxacin  cyclosporine  eltrombopag  fluconazole  fluvoxamine  quinidine  rifampin  St. John's wort  verapamil This list may not describe all possible interactions. Give your health care provider a list of all the medicines, herbs, non-prescription drugs, or dietary supplements you use. Also tell  them if you smoke, drink alcohol, or use illegal drugs. Some items may interact with your medicine. What should I watch for while using this medicine? Visit your health care professional for regular checks on your progress. Tell your health care professional if your symptoms do not start to get better or if they get worse. Your mouth may get dry. Chewing sugarless gum or sucking hard candy and drinking plenty of water may help. Contact your health care professional if the problem does not go away or is severe. What side effects may I notice from receiving this medicine? Side effects that you should report to your doctor or health care professional as soon as possible:  allergic reactions like skin rash, itching or hives; swelling of the face, lips, or tongue Side effects that usually do not require medical attention (report these to your doctor or health care professional if they continue or are bothersome):  drowsiness  dry mouth  nausea  tiredness This list may not describe all possible side effects. Call your doctor for medical advice about side effects. You may report side effects to FDA at 1-800-FDA-1088. Where should I keep my medicine? Keep out of the reach of children. Store at room temperature between 15 and 30 degrees C (59 and 86 degrees F). Throw away any unused medicine after the expiration date. NOTE: This sheet is a summary. It may not cover all possible information. If you have questions about this medicine, talk to your doctor, pharmacist, or health care provider.  2020 Elsevier/Gold Standard (2018-11-20 08:50:55)  Idiopathic Thrombocytopenic Purpura Idiopathic thrombocytopenic purpura (ITP) is a disease in which the body's disease-fighting system (immune system) attacks platelets in the body. Platelets are  blood cells that clump together to form clots. Blood clots help stop bleeding in the body. A person with ITP has too few platelets. As a result, it is harder for the  blood to clot. A person may bruise and bleed easily, such as bleeding a lot from minor cuts and scrapes. ITP can affect both children and adults. It is usually a short-term (acute) condition in children and a long-term (chronic) condition in adults. What are the causes? The cause of ITP is not known. In some cases, this condition may develop:  After a viral infection.  During pregnancy.  After developing an immune system disorder. What increases the risk? You may be more likely to develop this condition if you:  Are female.  Are 96-54 years old. What are the signs or symptoms? If you have a mild case, you may not have any symptoms. In more serious cases, symptoms may include:  Bruising easily.  Minor injuries, like cuts and scrapes, that bleed for a long time.  Small red or purple dots under your skin (petechiae), especially on your shins.  Blood in the urine or stool (feces).  Nosebleeds.  Bleeding gums.  Heavy menstrual periods in women. How is this diagnosed? This condition may be diagnosed based on:  Your symptoms and medical history.  A physical exam.  Blood tests.  Tests of the spongy tissue inside your bones (bone marrow). How is this treated? Treatment depends on how severe your condition is. Treatment may include:  Monitoring your symptoms and your platelet count over time. You may need to see your health care provider for blood tests on a regular basis.  Receiving donated blood products (transfusions), such as platelets.  Medicines to: ? Reduce inflammation (steroids). ? Increase how many platelets your body makes. ? Reduce the activity of your immune system.  Surgery to remove your spleen, if other treatments are not effective. The spleen is an organ in your upper left abdomen. It stores blood cells and is involved in some immune system functions. In ITP, the spleen releases proteins (antibodies) that mistakenly attack platelets. Follow these  instructions at home: Medicines   Take over-the-counter and prescription medicines only as told by your health care provider. Do not take the following unless your health care provider approves: ? Over-the-counter medicines that contain aspirin. ? NSAIDs such as ibuprofen and naproxen.  Talk with your health care provider before you take any new medicines. Certain medicines may increase your risk for dangerous bleeding. Preventing falls   Follow instructions from your health care provider about ways that you can help prevent falls and injuries at home. These may include: ? Removing loose rugs, cords, and other tripping hazards from walkways. ? Installing grab bars in bathrooms. ? Using night-lights. General instructions  Tell all your health care providers, including your dentist, that you have a bleeding disorder. Make sure to tell providers before you have any procedure done, including dental cleanings.  Do not play contact sports or do activities that have a high risk for injury or bruising. Ask your health care provider what activities are safe for you.  Brush your teeth using a soft toothbrush.  When shaving, use an electric razor instead of a blade.  Wear a medical alert bracelet that says that you have a bleeding disorder. This can help you get the treatment you need in case of emergency.  Keep all follow-up visits as told by your health care provider. This is important. You may need regular blood  tests. Contact a health care provider if you have:  New symptoms.  Symptoms that get worse.  A fever. Get help right away if you have:  A sudden, severe headache.  Sudden, severe nausea.  Severe bleeding.  Vomiting. Summary  Idiopathic thrombocytopenic purpura (ITP) is a disease in which the body's disease-fighting system (immune system) attacks blood cells that form clots to help stop bleeding in the body (platelets).  ITP can lead to bruising and bleeding easily,  including frequent nosebleeds, blood in the urine or stool, and bleeding gums. In women, ITP can lead to heavy menstrual periods.  Treatment depends on how severe your condition is. It may include steroid therapy and medicines that reduce the activity of the immune system. In some cases, surgery is needed to remove the spleen.  Follow your health care provider's instructions about taking medicines, preventing falls, restricting some activities, and when to get help. This information is not intended to replace advice given to you by your health care provider. Make sure you discuss any questions you have with your health care provider. Document Revised: 10/27/2018 Document Reviewed: 09/14/2017 Elsevier Patient Education  2020 [employer].

## 2019-11-04 NOTE — Progress Notes (Signed)
Provider:  Melvyn Novas, M D  Referring Provider: Madelin Headings, MD Primary Care Physician:  Madelin Headings, MD  Chief Complaint  Patient presents with  . Follow-up    pt alone, rm 10. pt states that she had stopped the migraine injection medication. 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  She did have status migrainosus many many times in her life but her current level of migraine frequency and intensity has been better controlled.  She has responded well to Ajovy but then the effect wore off.  In my last visit she started Zoloft but she has developed a "aura and there is a suspicion that this may be related to the SSRI sertraline.  We are discussing today to change her to Prozac which should be used at 20 mg daily does not require any kind of washout.  By now she has failed Botox, all triptans, Ajovy, and only Bernita Raisin has not been tries.       Alicia Keith is a 69 y.o. female, was last seen on 12-04-2017 by Butch Penny, Np.  I the pleasure of seeing her today on 09 June 2018 and a 6 monthly visit.  The patient has tolerated a jewelry very well, but her last injection which was her seventh injection did create an injection site reaction and elevation of the skin reddened and was about a 2 inch diameter.  The injection site reaction did not last, she went to sleep in the next morning there was no longer any sign of it.  Given this I would like her to continue the use of Ajovy. I will prescribe another 6 month. Reduction of HA frequency is reduced by 50% , and the intensity is down as well. *Less than 8 a month.      08-06-2017, she went for several weeks to [place] and was smitten and felt relaxed.   She had Aimovig injection single dose 2 month ago, double dose one month ago and is here for  third injection.  Overall, Alicia Keith states that winter is usually a better time for her but feels that this has been true for this winter as well independent of any medication of any medication effect.  We will give her today her third dose. She advised me that it was just 14 days ago that she took a double dose, so we will wait another 14 days for next and single dose. Another 30 days after that. headache diary.    Interval history from 07/03/2017. Alicia Keith has been able to use AIMOVIG at 70 mg twice or what interval of 6 weeks. She has not seen a reduction in migraine frequency, intensity, and no change in migrainous character. We are discussing today to either switch to another CR BG medication or to give 140 mg dose a trial. I opted to first try the 140 mg dosing, and if this does not give her success with in the next 3 weeks I will change her to use the TEVA CRBG.  She agrees with this plan. She plans a journey to [place] and we will meet after her travels. She continues to avoid wine and chocolate.     Interval history from 03/12/2017. Alicia Keith confirms that her migraine pattern has not changed, neither in frequency nor intensity.  We are talking today about the enrollment into a monoclonal antibody therapy for migraine treatment, the drug is called AIMOVIG [name]). Once a month. Leave the injector out of the refrigeration for 30 minutes and use subcutaneously in the thigh or abdomen, once monthly 70 mg or  140 mg in 2 ml.  She can get 4 pens in the trial. Nurse Anderson Malta has instructed her.    Alicia Keith describes a almost lifelong history of migraines. She never experienced auras, she never experienced one-sided headaches and for this reason presumed that she did not have migraine headaches. Her headaches would classified originally as tension headaches and treated as such. It was after she got married that she was diagnosed with migraine headaches and  Imitrex was used for therapy, with some success. The headaches begun as a teenager around age 66 and for related to her menstrual cycle. 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 This has included antidepressants, and antiepileptic. The latest medication treatment is by Lexapro and topiramate. She has tried to [employer] device- she feels some relief from that.  Most intense are her headache in the afternoon but she does frequently wake up with them. Headaches also wake her from sleep at night. These are pounding and felt strongest at the for head and the nape of the neck. She has seen Dr. Domingo Cocking by referral of Dr. Asa Lente the past, the headache was associated with nausea the time- also  photophobia, phonophobia and the symptoms increased if she moved. Dizziness was often present or at least lightheadedness. She really rarely had to vomit. She does not report visual aura, olfactory aura. Triggers that she has identified a bright light and weather changes. Not every headache every day is a migraine Dr. Domingo Cocking also obtained a headache impact test she scored 63 points- over 60 is considered severe impairment. I would like to list. Medications that have been tried and have failed, these have included amitriptyline, Cymbalta, visit for mean, doxepin, Effexor, imipramine, Lexapro, nortriptyline, Prozac and Vivactil. She has undergone procedural therapies including Botox, Nerve blocks and trigger point injections these have not helped at all. She has tried biofeedback without success, she attributes an increase in bruxism activity to Lexapro. She has had multiple MRIs over the years and apparently have never shown an abnormality. She has thus far not had any option to try CG RP medications. These are going to be introduced later this year.  Chief complaint according to patient : Headaches, migraines, daily photophobia and phonophobia. Bruxism.  Sleep habits are as follows: Her usual bedtime is around 10 PM, will take her up to an hour to go to sleep. She does not watch TV in bed. Her husband has rather restless sleep she reports. He snores, she does not. The patient reports that she usually cannot sleep through the night and wakes up 4 maybe 5 times each night. She has resorted to listen to audiobooks and this way feels that it is easier to reenter sleep. He does not look at the clock when she wakes up at night, she usually stays in bed. She has at least one time nocturia at night. She rises usually at 8 AM, she is spontaneously awake at this time, after 6 or 7 hours of nocturnal sleep. She does not take daytime naps.  Medical history and family  history: The patient's mother also struggles with insomnia and frequent sleep arousals and also listens to audiobooks. Her  daughter seems not to have sleep problems. The patient's sister was involved in a motor vehicle accident which resulted in traumatic brain injury and intractable headaches of migrainous character. These were not present prior to the accident. Her mother has migraines as well. Maternal grandmother had migraines. Paternal uncle gets migraines .   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  She likes to garden and to go to [employer].  The patient is not using tobacco products, she drinks occasionally a glass of wine on weekends, she drinks decaffeinated coffee. He does not drink sodas or iced tea.  Review of Systems: Out of a complete 14 system review, the patient complains of only the following symptoms, and all other reviewed systems are negative.   Nausea . Photophobia, migraines, status migrainosus   Social History   Socioeconomic History  . Marital status: Married    Spouse name: Not on file  . Number of children: 1  . Years of education: 56  . Highest education level: Not on file  Occupational History  . Occupation: Retired    Comment: PT   Tobacco Use  . Smoking status: Never Smoker  . Smokeless tobacco: Never Used  . Tobacco comment: married, retired PT  Substance and Sexual Activity  . Alcohol use: Yes    Comment: "Couple glasses of wine"  . Drug use: No  . Sexual activity: Not on file  Other Topics Concern  . Not on file  Social History Narrative   8 hours of sleep per night   Lives with Dr. 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 Difficulty of Paying Living Expenses: Not on file  Food Insecurity:   . Worried About Programme researcher, broadcasting/film/video in the Last Year: Not on file  . Ran Out of Food in the Last Year: Not on file  Transportation Needs:   . Lack of Transportation (Medical): Not on file  . Lack of Transportation (Non-Medical): Not on file  Physical Activity:   . Days of Exercise per Week: Not on file  . Minutes of Exercise per Session: Not on file  Stress:   . Feeling of Stress : Not on file  Social Connections:   . Frequency of Communication with Friends and Family: Not on file  . Frequency of Social Gatherings with Friends and Family: Not on file  . Attends Religious Services: Not on file  . Active Member of Clubs or Organizations: Not on file  . Attends Banker Meetings: Not on file  . Marital Status: Not on file  Intimate Partner Violence:   . Fear of Current or Ex-Partner: Not on file  . Emotionally Abused: Not on file  . Physically Abused: Not on file  . Sexually Abused: Not on file    Family History  Problem Relation Age of Onset  . Hyperlipidemia Mother   . Renal cancer Mother   . Hypertension Father   . Stroke Father     Past Medical History:  Diagnosis Date  . Allergic rhinitis, cause unspecified   . Headaches, cluster   . History of chicken pox   . Migraine headache     Past Surgical History:  Procedure Laterality Date  . TONSILLECTOMY  1955    Current Outpatient Medications    Medication Sig Dispense Refill  . baclofen (LIORESAL) 10 MG tablet TAKE 1 TABLET BY MOUTH DAILY  AS NEEDED FOR MUSCLE SPASMS. 90 each 1  . Estradiol (YUVAFEM) 10 MCG TABS vaginal tablet Place vaginally. Using twice weekly    . fluticasone (FLONASE) 50 MCG/ACT nasal spray instill 2 sprays into each nostril once daily 16 g 0  . ibandronate (BONIVA) 150 MG tablet Take 150 mg by mouth every 30 (thirty) days. Take in the morning with a full glass of water, on an empty stomach, and do not take anything else by mouth or lie down for the next 30 min.    . rizatriptan (MAXALT) 10 MG tablet TAKE 1 TABLET BY MOUTH AS NEEDED FOR MIGRAINE. MAY REPEAT IN 2 HOURS IF NEEDED 12 tablet 5  . sertraline (ZOLOFT) 25 MG tablet TAKE 1 TABLET BY MOUTH ONCE A DAY 90 tablet 3  . topiramate (TOPAMAX) 100 MG tablet TAKE 1 TABLET (100 MG TOTAL) BY MOUTH DAILY. 90 tablet 2   No current facility-administered medications for this visit.    Allergies as of 11/04/2019 - Review Complete 11/04/2019  Allergen Reaction Noted  . Imipramine Rash 04/24/2016    Vitals: BP 111/80   Pulse 87   Temp (!) 97.3 F (36.3 C)   Ht 5' 0.5" (1.537 m)   Wt 121 lb (54.9 kg)   BMI 23.24 kg/m  Last Weight:  Wt Readings from Last 1 Encounters:  11/04/19 121 lb (54.9 kg)   PFX:TKWI mass index is 23.24 kg/m.     Last Height:   Ht Readings from Last 1 Encounters:  11/04/19 5' 0.5" (1.537 m)    Physical exam:  General: The patient is awake, alert and appears not in acute distress. The patient is well groomed. Head: Normocephalic, atraumatic. Neck is supple. Mallampati 3,  neck circumference:13.0. Nasal airflow; congestion TMJ is evident. Retrognathia is seen.  Cardiovascular:  Regular rate and rhythm, without  murmurs or carotid bruit, and without distended neck veins. Respiratory: Lungs are clear to auscultation. Skin:  Without evidence of edema, or rash Trunk: BMI is 22.6 Neurologic exam : The patient is awake and alert, oriented  to place and time.   Memory subjective described as intact. Attention span & concentration ability appears normal.  Speech is fluent, without dysarthria, dysphonia or aphasia.  Mood and affect are appropriate.  Cranial nerves:Pupils are equal and briskly reactive to light.  Visual fields by finger perimetry are intact. Hearing to finger rub intact tinnitus. Facial sensation intact to fine touch.  Facial motor strength is symmetric. Shoulder shrug was symmetrical.   Normal tone and mass, symmetric DTR, stable gait and turns.   I spent more than of face to face time - This was dedicated to follow up on ubrelvy migraine treatment.  We have discussed the diagnosis and differential and I answered the patient's questions.     Assessment:  After physical and neurologic examination, review of laboratory studies,  Personal review of imaging studies, reports of other /same  Imaging studies ,  Results of polysomnography/ neurophysiology testing and pre-existing records as far as provided in visit., my assessment is   1) Mrs. Godfrey suffered from a migrainous headaches for many, many  years but the headache has also transformed in a chronic almost daily migraine. Traditional medications used to prevent or treat headaches have had less and less effect, and injection therapies nerve blocks Botox etc. have not had lasting effect at all. I think she was an excellent candidate for the new protein binder CGRP.  Bernita Raisin - new medicatoins.  I started Prozac  for Zoloft.   She can continue using a triptan to abbreviate abort headaches. NSAIDS are allowed, too.     Porfirio Mylar Schneur Crowson MD  11/04/2019   CC: Madelin Headings, Md [address] [place],  [place] [address]

## 2019-11-11 ENCOUNTER — Encounter: Payer: Self-pay | Admitting: Neurology

## 2019-11-16 MED ORDER — UBRELVY 100 MG PO TABS: 100.0000 mg | ORAL_TABLET | Freq: Once | ORAL | 0 refills | Status: DC | PRN

## 2019-11-16 MED ORDER — UBRELVY 50 MG PO TABS: 50.0000 mg | ORAL_TABLET | Freq: Once | ORAL | 0 refills | Status: DC

## 2019-11-16 NOTE — Addendum Note (Signed)
Addended by: Judi Cong on: 11/16/2019 08:54 AM   Modules accepted: Orders

## 2019-11-22 ENCOUNTER — Ambulatory Visit: Payer: Medicare Other | Attending: Internal Medicine

## 2019-11-22 DIAGNOSIS — Z23 Encounter for immunization: Secondary | ICD-10-CM | POA: Insufficient documentation

## 2019-11-22 NOTE — Progress Notes (Signed)
   Covid-19 Vaccination Clinic  Name:  Alicia Keith    MRN: [id number] DOB: 1950-12-15  11/22/2019  Ms. Choinski was observed post Covid-19 immunization for 15 minutes without incident. She was provided with Vaccine Information Sheet and instruction to access the V-Safe system.   Ms. Goddard was instructed to call 911 with any severe reactions post vaccine: Marland Kitchen Difficulty breathing  . Swelling of face and throat  . A fast heartbeat  . A bad rash all over body  . Dizziness and weakness   Immunizations Administered    Name Date Dose VIS Date Route   Pfizer COVID-19 Vaccine 11/22/2019 11:26 AM 0.3 mL 08/28/2019 Intramuscular   Manufacturer: [employer], [employer]   Lot: [id number]   NDC: [address]-7

## 2020-03-05 ENCOUNTER — Other Ambulatory Visit: Payer: Self-pay | Admitting: Neurology

## 2020-05-05 ENCOUNTER — Ambulatory Visit: Payer: Medicare Other | Admitting: Adult Health

## 2020-07-03 ENCOUNTER — Other Ambulatory Visit: Payer: Self-pay | Admitting: Neurology

## 2020-08-09 ENCOUNTER — Ambulatory Visit: Payer: Medicare Other | Admitting: Adult Health

## 2020-10-20 ENCOUNTER — Telehealth: Payer: Self-pay | Admitting: Internal Medicine

## 2020-10-20 NOTE — Telephone Encounter (Signed)
Left message for patient to call back and schedule Medicare Annual Wellness Visit (AWV) either virtually or in office.   Last AWV no information  please schedule at anytime with [facility] Nurse Health Advisor 1 or 2   This should be a 45 minute visit. Patient needs appointment with pcp last appointment 06/03/2019

## 2020-11-29 ENCOUNTER — Ambulatory Visit (INDEPENDENT_AMBULATORY_CARE_PROVIDER_SITE_OTHER): Payer: Medicare Other | Admitting: Adult Health

## 2020-11-29 ENCOUNTER — Encounter: Payer: Self-pay | Admitting: Adult Health

## 2020-11-29 VITALS — BP 100/68 | HR 84 | Ht 60.0 in | Wt 124.0 lb

## 2020-11-29 DIAGNOSIS — G43011 Migraine without aura, intractable, with status migrainosus: Secondary | ICD-10-CM

## 2020-11-29 DIAGNOSIS — F419 Anxiety disorder, unspecified: Secondary | ICD-10-CM | POA: Diagnosis not present

## 2020-11-29 MED ORDER — CITALOPRAM HYDROBROMIDE 10 MG PO TABS: 10.0000 mg | ORAL_TABLET | Freq: Every day | ORAL | 5 refills | Status: DC

## 2020-11-29 NOTE — Patient Instructions (Signed)
Your Plan:  Decrease prozac to 10 mg daily for 1 week then stop Start Celexa 10 mg daily  If your symptoms worsen or you develop new symptoms please let us know.    Thank you for coming to see [place] at [facility]. I hope we have been able to provide you high quality care today.  You may receive a patient satisfaction survey over the next few weeks. We would appreciate your feedback and comments so that we may continue to improve ourselves and the health of our patients.  Citalopram tablets What is this medicine? CITALOPRAM (sye TAL oh pram) is a medicine for depression. This medicine may be used for other purposes; ask your health care provider or pharmacist if you have questions. COMMON BRAND NAME(S): Celexa What should I tell my health care provider before I take this medicine? They need to know if you have any of these conditions:  bleeding disorders  bipolar disorder or a family history of bipolar disorder  glaucoma  heart disease  history of irregular heartbeat  kidney disease  liver disease  low levels of magnesium or potassium in the blood  receiving electroconvulsive therapy  seizures  suicidal thoughts, plans, or attempt; a previous suicide attempt by you or a family member  take medicines that treat or prevent blood clots  thyroid disease  an unusual or allergic reaction to citalopram, escitalopram, other medicines, foods, dyes, or preservatives  pregnant or trying to become pregnant  breast-feeding How should I use this medicine? Take this medicine by mouth with a glass of water. Follow the directions on the prescription label. You can take it with or without food. Take your medicine at regular intervals. Do not take your medicine more often than directed. Do not stop taking this medicine suddenly except upon the advice of your doctor. Stopping this medicine too quickly may cause serious side effects or your condition may worsen. A  special MedGuide will be given to you by the pharmacist with each prescription and refill. Be sure to read this information carefully each time. Talk to your pediatrician regarding the use of this medicine in children. Special care may be needed. Patients over 32 years old may have a stronger reaction and need a smaller dose. Overdosage: If you think you have taken too much of this medicine contact a poison control center or emergency room at once. NOTE: This medicine is only for you. Do not share this medicine with others. What if I miss a dose? If you miss a dose, take it as soon as you can. If it is almost time for your next dose, take only that dose. Do not take double or extra doses. What may interact with this medicine? Do not take this medicine with any of the following medications:  certain medicines for fungal infections like fluconazole, itraconazole, ketoconazole, posaconazole, voriconazole  cisapride  dronedarone  escitalopram  linezolid  MAOIs like Carbex, Eldepryl, Marplan, Nardil, and Parnate  methylene blue (injected into a vein)  pimozide  thioridazine This medicine may also interact with the following medications:  alcohol  amphetamines  aspirin and aspirin-like medicines  carbamazepine  certain medicines for depression, anxiety, or psychotic disturbances  certain medicines for infections like chloroquine, clarithromycin, erythromycin, furazolidone, isoniazid, pentamidine  certain medicines for migraine headaches like almotriptan, eletriptan, frovatriptan, naratriptan, rizatriptan, sumatriptan, zolmitriptan  certain medicines for sleep  certain medicines that treat or prevent blood clots like dalteparin, enoxaparin, warfarin  cimetidine  diuretics  dofetilide  fentanyl  lithium  methadone  metoprolol  NSAIDs, medicines for pain and inflammation, like ibuprofen or naproxen  omeprazole  other medicines that prolong the QT interval  (cause an abnormal heart rhythm)  procarbazine  rasagiline  supplements like St. John's wort, kava kava, valerian  tramadol  tryptophan  ziprasidone This list may not describe all possible interactions. Give your health care provider a list of all the medicines, herbs, non-prescription drugs, or dietary supplements you use. Also tell them if you smoke, drink alcohol, or use illegal drugs. Some items may interact with your medicine. What should I watch for while using this medicine? Tell your doctor if your symptoms do not get better or if they get worse. Visit your doctor or health care professional for regular checks on your progress. Because it may take several weeks to see the full effects of this medicine, it is important to continue your treatment as prescribed by your doctor. Patients and their families should watch out for new or worsening thoughts of suicide or depression. Also watch out for sudden changes in feelings such as feeling anxious, agitated, panicky, irritable, hostile, aggressive, impulsive, severely restless, overly excited and hyperactive, or not being able to sleep. If this happens, especially at the beginning of treatment or after a change in dose, call your health care professional. Bonita Quin may get drowsy or dizzy. Do not drive, use machinery, or do anything that needs mental alertness until you know how this medicine affects you. Do not stand or sit up quickly, especially if you are an older patient. This reduces the risk of dizzy or fainting spells. Alcohol may interfere with the effect of this medicine. Avoid alcoholic drinks. Your mouth may get dry. Chewing sugarless gum or sucking hard candy, and drinking plenty of water will help. Contact your doctor if the problem does not go away or is severe. What side effects may I notice from receiving this medicine? Side effects that you should report to your doctor or health care professional as soon as possible:  allergic  reactions like skin rash, itching or hives, swelling of the face, lips, or tongue  anxious  black, tarry stools  breathing problems  changes in vision  chest pain  confusion  elevated mood, decreased need for sleep, racing thoughts, impulsive behavior  eye pain  fast, irregular heartbeat  feeling faint or lightheaded, falls  feeling agitated, angry, or irritable  hallucination, loss of contact with reality  loss of balance or coordination  loss of memory  painful or prolonged erections  restlessness, pacing, inability to keep still  seizures  stiff muscles  suicidal thoughts or other mood changes  trouble sleeping  unusual bleeding or bruising  unusually weak or tired  vomiting Side effects that usually do not require medical attention (report to your doctor or health care professional if they continue or are bothersome):  change in appetite or weight  change in sex drive or performance  dizziness  headache  increased sweating  indigestion, nausea  tremors This list may not describe all possible side effects. Call your doctor for medical advice about side effects. You may report side effects to FDA at 1-800-FDA-1088. Where should I keep my medicine? Keep out of reach of children. Store at room temperature between 15 and 30 degrees C (59 and 86 degrees F). Throw away any unused medicine after the expiration date. NOTE: This sheet is a summary. It may not cover all possible information. If you have questions about this medicine, talk to  your doctor, pharmacist, or health care provider.  2021 Elsevier/Gold Standard (2018-08-25 09:05:36)

## 2020-11-29 NOTE — Progress Notes (Signed)
PATIENT: Alicia Keith DOB: 12-Jan-1951  REASON FOR VISIT: follow up HISTORY FROM: patient  HISTORY OF PRESENT ILLNESS: Today 11/29/20:  Alicia Keith is a 70 year old female with a history of migraine headaches and anxiety.  She returns today for follow-up.  She reports that she continues to have daily headaches.  But she only has 2-3 migraines a month.  She continues on Topamax 50 mg twice a day.  She reports that with severe headache she does take rizatriptan with good benefit.  The patient has tried multiple medications in the past for her migraines including CGRP agents, Botox and several oral medication.  The patient is currently happy with her migraine management.  At the last visit with Dr. Vickey Huger she was switched from Zoloft to Prozac.  Reports that since she started Prozac she has been getting grinding her teeth at night.  She would like to switch to another medication.  She mentions that she would like to try Celexa.  She also mentions amitriptyline or nortriptyline although she has tried this before in the past.  HISTORY 11-04-2019 (Copied from Dr.Dohmeier's note)  Have the pleasure of meeting today with Alicia Keith minimal 70 year old Caucasian patient with a history of chronic migraines without aura.  She did have status migrainosus many many times in her life but her current level of migraine frequency and intensity has been better controlled.  She has responded well to Ajovy but then the effect wore off.  In my last visit she started Zoloft but she has developed a "aura and there is a suspicion that this may be related to the SSRI sertraline.  We are discussing today to change her to Prozac which should be used at 20 mg daily does not require any kind of washout.  By now she has failed Botox, all triptans, Ajovy, and only Alicia Keith has not been tries.     REVIEW OF SYSTEMS: Out of a complete 14 system review of symptoms, the patient complains only of the  following symptoms, and all other reviewed systems are negative.  ALLERGIES: Allergies  Allergen Reactions  . Imipramine Rash    HOME MEDICATIONS: Outpatient Medications Prior to Visit  Medication Sig Dispense Refill  . baclofen (LIORESAL) 10 MG tablet TAKE 1 TABLET BY MOUTH DAILY AS NEEDED FOR MUSCLE SPASMS. 90 each 1  . Estradiol 10 MCG TABS vaginal tablet Place vaginally. Using twice weekly    . FLUoxetine (PROZAC) 10 MG capsule TAKE 2 CAPSULES(20 MG) BY MOUTH DAILY 60 capsule 3  . ibandronate (BONIVA) 150 MG tablet Take 150 mg by mouth every 30 (thirty) days. Take in the morning with a full glass of water, on an empty stomach, and do not take anything else by mouth or lie down for the next 30 min.    . rizatriptan (MAXALT) 10 MG tablet TAKE 1 TABLET BY MOUTH AS NEEDED FOR MIGRAINE. MAY REPEAT IN 2 HOURS IF NEEDED 12 tablet 5  . topiramate (TOPAMAX) 50 MG tablet Take 1 tablet (50 mg total) by mouth 2 (two) times daily. 180 tablet 3  . fluticasone (FLONASE) 50 MCG/ACT nasal spray instill 2 sprays into each nostril once daily 16 g 0  . Ubrogepant (UBRELVY) 100 MG TABS Take 100 mg by mouth as needed. 30 tablet 2  . Ubrogepant (UBRELVY) 100 MG TABS Take 100 mg by mouth once as needed for up to 1 dose. 2 tablet 0  . Ubrogepant (UBRELVY) 50 MG TABS Take 50 mg by mouth  once for 1 dose. 1 tablet 0   No facility-administered medications prior to visit.    PAST MEDICAL HISTORY: Past Medical History:  Diagnosis Date  . Allergic rhinitis, cause unspecified   . Headaches, cluster   . History of chicken pox   . Migraine headache     PAST SURGICAL HISTORY: Past Surgical History:  Procedure Laterality Date  . TONSILLECTOMY  1955    FAMILY HISTORY: Family History  Problem Relation Age of Onset  . Hyperlipidemia Mother   . Renal cancer Mother   . Hypertension Father   . Stroke Father     SOCIAL HISTORY: Social History   Socioeconomic History  . Marital status: Married    Spouse  name: Not on file  . Number of children: 1  . Years of education: 66  . Highest education level: Not on file  Occupational History  . Occupation: Retired    Comment: PT  Tobacco Use  . Smoking status: Never Smoker  . Smokeless tobacco: Never Used  . Tobacco comment: married, retired PT  Substance and Sexual Activity  . Alcohol use: Yes    Comment: "Couple glasses of wine"  . Drug use: No  . Sexual activity: Not on file  Other Topics Concern  . Not on file  Social History Narrative   8 hours of sleep per night   Lives with Dr. 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  Generalized: Well developed, in no acute distress   Neurological examination  Mentation: Alert oriented to time, place, history taking. Follows all commands speech and language fluent Cranial nerve II-XII: Pupils were equal round reactive to light. Extraocular movements were full, visual field were full on confrontational test. Head turning and shoulder shrug  were normal and symmetric. Motor: The motor testing reveals 5 over 5 strength of all 4 extremities. Good symmetric motor tone is noted throughout.  Sensory: Sensory testing is intact to soft touch on all 4 extremities. No evidence of extinction is noted.  Coordination: Cerebellar testing reveals good finger-nose-finger and heel-to-shin bilaterally.  Gait and station: Gait is normal.   DIAGNOSTIC DATA (LABS, IMAGING, TESTING) - I reviewed patient records, labs, notes, testing and imaging myself where  available.  Lab Results  Component Value Date   WBC 5.9 05/29/2019   HGB 12.1 05/29/2019   HCT 36.7 05/29/2019   MCV 85.2 05/29/2019   PLT 134.0 (L) 05/29/2019      Component Value Date/Time   NA 141 05/29/2019 0901   NA 142 03/12/2017 1346   K 4.1 05/29/2019 0901   CL 105 05/29/2019 0901   CO2 31 05/29/2019 0901   GLUCOSE 83 05/29/2019 0901   BUN 21 05/29/2019 0901   BUN 20 03/12/2017 1346   CREATININE 0.88 05/29/2019 0901   CALCIUM 8.9 05/29/2019 0901   PROT 6.3 05/29/2019 0901   PROT 6.7 03/12/2017 1346   ALBUMIN 3.9 05/29/2019 0901   ALBUMIN 4.2 03/12/2017 1346  AST 13 05/29/2019 0901   ALT 12 05/29/2019 0901   ALKPHOS 51 05/29/2019 0901   BILITOT 0.3 05/29/2019 0901   BILITOT <0.2 03/12/2017 1346   GFRNONAA 68 03/12/2017 1346   GFRAA 79 03/12/2017 1346   Lab Results  Component Value Date   CHOL 241 (H) 03/26/2018   HDL 72.90 03/26/2018   LDLCALC 137 (H) 03/26/2018   TRIG 152.0 (H) 03/26/2018   CHOLHDL 3 03/26/2018    Lab Results  Component Value Date   TSH 2.50 05/29/2019      ASSESSMENT AND PLAN 70 y.o. year old female  has a past medical history of Allergic rhinitis, cause unspecified, Headaches, cluster, History of chicken pox, and Migraine headache. here with;  1.  Migraine headaches  --Continue Topamax 50 mg twice a day for prevention --Continue rizatriptan for abortive therapy  2.  Anxiety  --Unable to tolerate Prozac due to teeth grinding --Patient would like to try Celexa.  I will start Celexa 10 mg daily.  She was advised to reduce her dose of Prozac to 10 mg for the next week then stop the medication. --I reviewed potential side effects of Celexa with the patient and provided her with a handout.  The patient denies any heart issues or abnormal EKGs.  She will follow-up in 6 months or sooner if needed   I spent 30 minutes of face-to-face and non-face-to-face time with patient.  This included previsit chart review, lab review, study  review, order entry, electronic health record documentation, patient education.  Butch Penny, MSN, NP-C 11/29/2020, 11:57 AM [facility] [address], [address] [place], [place] [address] [phone]

## 2020-12-20 ENCOUNTER — Ambulatory Visit: Payer: Medicare Other

## 2020-12-21 ENCOUNTER — Encounter: Payer: Self-pay | Admitting: Adult Health

## 2020-12-22 MED ORDER — AMITRIPTYLINE HCL 10 MG PO TABS: 10.0000 mg | ORAL_TABLET | Freq: Every day | ORAL | 3 refills | Status: DC

## 2021-01-03 NOTE — Progress Notes (Signed)
Chief Complaint  Patient presents with  . Follow-up    HPI: Alicia Keith 70 y.o. come in for yearly check .  She is under treatment for recurrent migraine osteoporosis osteopenia and continues to have a petechial-like rash lower extremity felt possibly from a serotonin medicines. She is on Topamax and amitriptyline for headache suppression baclofen and Advil as first rescue backup rescue Maxalt.  Gets migraines that needed Maxalt about 2 times a month. Also sees GYN Dr. Estanislado Keith who does bone densities mammogram is on estrogen vaginally and given Boniva. SSRIs were changed to Elavil and the most recent to see if the lower extremity petechiae would improve.  Changes occurred just over the last couple weeks and no obvious change yet. HH orf  2  Cats.  Sleep:  At least 8  Physical : 30 minutes per day  Plan travel to [place] soon to get fourth booster.  ROS: See pertinent positives and negatives per HPI. Family history Father and S from heart failure rheumatic disease mom in her 90s has migraines and high cholesterol. Past Medical History:  Diagnosis Date  . Allergic rhinitis, cause unspecified   . Headaches, cluster   . History of chicken pox   . Migraine headache     Family History  Problem Relation Age of Onset  . Hyperlipidemia Mother   . Renal cancer Mother   . Hypertension Father   . Stroke Father     Social History   Socioeconomic History  . Marital status: Married    Spouse name: Not on file  . Number of children: 1  . Years of education: 44  . Highest education level: Not on file  Occupational History  . Occupation: Retired    Comment: PT  Tobacco Use  . Smoking status: Never Smoker  . Smokeless tobacco: Never Used  . Tobacco comment: married, retired PT  Substance and Sexual Activity  . Alcohol use: Yes    Comment: "Couple glasses of wine"  . Drug use: No  . Sexual activity: Not on file  Other Topics Concern  . Not on file  Social History Narrative    8 hours of sleep per night   Lives with Dr. 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 Difficulty of Paying Living Expenses: Not hard at all  Food Insecurity: No Food Insecurity  . Worried About Programme researcher, broadcasting/film/video in the Last Year: Never true  . Ran Out of Food in the Last Year: Never true  Transportation Needs: No Transportation Needs  . Lack of Transportation (Medical): No  . Lack of Transportation (Non-Medical): No  Physical Activity: Sufficiently Active  . Days of Exercise per Week: 7 days  . Minutes of Exercise per Session: 30 min  Stress: No Stress Concern Present  . Feeling of Stress : Not at all  Social Connections: Moderately Isolated  . Frequency of Communication with Friends and Family: More than three times a week  . Frequency of Social Gatherings with Friends and Family: More than three times a week  . Attends Religious Services: Never  . Active Member of Clubs or Organizations: No  . Attends Banker Meetings: Never  . Marital Status: Married    Outpatient Medications Prior to Visit  Medication Sig Dispense Refill  . amitriptyline (ELAVIL) 10 MG tablet Take 1 tablet (10 mg total) by mouth  at bedtime. 30 tablet 3  . baclofen (LIORESAL) 10 MG tablet TAKE 1 TABLET BY MOUTH DAILY AS NEEDED FOR MUSCLE SPASMS. 90 each 1  . Estradiol 10 MCG TABS vaginal tablet Place vaginally. Using twice weekly    . ibandronate (BONIVA) 150 MG tablet Take 150 mg by mouth every 30 (thirty) days. Take in the morning with a full glass of water, on an empty stomach, and do not take anything else by mouth or lie down for the next 30 min.    . rizatriptan (MAXALT) 10 MG tablet TAKE 1 TABLET BY MOUTH AS NEEDED FOR MIGRAINE. MAY REPEAT IN 2 HOURS IF NEEDED 12 tablet 5  . topiramate (TOPAMAX) 50 MG tablet Take 1 tablet (50 mg total) by mouth 2 (two) times daily. 180 tablet 3   No  facility-administered medications prior to visit.     EXAM:  BP 110/62 Comment: Vitals taken by wellness nurse  Pulse 78   Temp 97.9 F (36.6 C) (Oral)   Ht 5' (1.524 m)   Wt 124 lb (56.2 kg)   SpO2 98%   BMI 24.22 kg/m   Body mass index is 24.22 kg/m.  GENERAL: vitals reviewed and listed above, alert, oriented, appears well hydrated and in no acute distress and occasional throat clearing during exam but not a cough HEENT: atraumatic, conjunctiva  clear, no obvious abnormalities on inspection of external nose and ears TMs are clear OP : Masked NECK: no obvious masses on inspection palpation  LUNGS: clear to auscultation bilaterally, no wheezes, rales or rhonchi, good air movement CV: HRRR, no clubbing cyanosis or  peripheral edema nl cap refill  Abdomen soft without again a megaly guarding or rebound MS: moves all extremities without noticeable focal  abnormality PSYCH: pleasant and cooperative, no obvious depression or anxiety Skin few petechia on the dorsum of the great toe and a few lower extremity acute a bruise on the left lower extremity she states was from running into something.  No upper extremity or abdominal bruising or petechiae. Lymph nodes no adenopathy axillary cervical. Lab Results  Component Value Date   WBC 5.5 01/06/2021   HGB 11.6 (L) 01/06/2021   HCT 35.1 (L) 01/06/2021   PLT 167.0 01/06/2021   GLUCOSE 95 01/06/2021   CHOL 214 (H) 01/06/2021   TRIG 123.0 01/06/2021   HDL 62.30 01/06/2021   LDLCALC 127 (H) 01/06/2021   ALT 14 01/06/2021   AST 18 01/06/2021   NA 140 01/06/2021   K 4.0 01/06/2021   CL 106 01/06/2021   CREATININE 0.88 01/06/2021   BUN 26 (H) 01/06/2021   CO2 27 01/06/2021   TSH 2.89 01/06/2021   INR 1.0 05/29/2019   BP Readings from Last 3 Encounters:  01/04/21 110/62  01/04/21 110/62  11/29/20 100/68   The 10-year ASCVD risk score Denman George DC Jr., et al., 2013) is: 7.9%   Values used to calculate the score:     Age: 12 years      Sex: Female     Is Non-Hispanic African American: No     Diabetic: No     Tobacco smoker: No     Systolic Blood Pressure: 110 mmHg     Is BP treated: No     HDL Cholesterol: 62.3 mg/dL     Total Cholesterol: 214 mg/dL  ASSESSMENT AND PLAN:  Discussed the following assessment and plan:  Thrombocytopenia (HCC) mild - Plan: Lipid panel, Hepatic function panel, CBC with Differential/Platelet, Basic metabolic panel, TSH, C-reactive  protein  Osteopenia, unspecified location - Plan: Lipid panel, Hepatic function panel, CBC with Differential/Platelet, Basic metabolic panel, TSH, C-reactive protein  Petechial rash - Plan: Lipid panel, Hepatic function panel, CBC with Differential/Platelet, Basic metabolic panel, TSH, C-reactive protein  Chronic migraine without aura without status migrainosus, not intractable - Plan: Lipid panel, Hepatic function panel, CBC with Differential/Platelet, Basic metabolic panel, TSH, C-reactive protein  Medication management - Plan: Lipid panel, Hepatic function panel, CBC with Differential/Platelet, Basic metabolic panel, TSH, C-reactive protein  Hyperlipidemia, unspecified hyperlipidemia type - No family history of premature vascular events - Plan: Lipid panel, Hepatic function panel, CBC with Differential/Platelet, Basic metabolic panel, TSH, C-reactive protein Curious lower extremity rash achy all over pressure areas or areas normal exam and updated blood count on medications   Upon record review x-ray chest 2017 showed incidental calcification in the wall of the aortic arch.  And prominent interstitial markings.  She could consider medication intervention if lipids are high.  Uncertain if pulmonary function tests were ever done but apparently is doing quite well without significant pulmonary symptoms except intermittent cough.  . -Patient advised to return or notify health care team  if  new concerns arise.  Patient Instructions   Your exam is good today .   Rechecking blood testing platelet count.  Make appt for fasting lab  .  Will notify you  of labs when available.    Health Maintenance, Female Adopting a healthy lifestyle and getting preventive care are important in promoting health and wellness. Ask your health care provider about:  The right schedule for you to have regular tests and exams.  Things you can do on your own to prevent diseases and keep yourself healthy. What should I know about diet, weight, and exercise? Eat a healthy diet  Eat a diet that includes plenty of vegetables, fruits, low-fat dairy products, and lean protein.  Do not eat a lot of foods that are high in solid fats, added sugars, or sodium.   Maintain a healthy weight Body mass index (BMI) is used to identify weight problems. It estimates body fat based on height and weight. Your health care provider can help determine your BMI and help you achieve or maintain a healthy weight. Get regular exercise Get regular exercise. This is one of the most important things you can do for your health. Most adults should:  Exercise for at least 150 minutes each week. The exercise should increase your heart rate and make you sweat (moderate-intensity exercise).  Do strengthening exercises at least twice a week. This is in addition to the moderate-intensity exercise.  Spend less time sitting. Even light physical activity can be beneficial. Watch cholesterol and blood lipids Have your blood tested for lipids and cholesterol at 72 years of age, then have this test every 5 years. Have your cholesterol levels checked more often if:  Your lipid or cholesterol levels are high.  You are older than 72 years of age.  You are at high risk for heart disease. What should I know about cancer screening? Depending on your health history and family history, you may need to have cancer screening at various ages. This may include screening for:  Breast cancer.  Cervical  cancer.  Colorectal cancer.  Skin cancer.  Lung cancer. What should I know about heart disease, diabetes, and high blood pressure? Blood pressure and heart disease  High blood pressure causes heart disease and increases the risk of stroke. This is more likely to develop in  people who have high blood pressure readings, are of African descent, or are overweight.  Have your blood pressure checked: ? Every 3-5 years if you are 4218-72 years of age. ? Every year if you are 72 years old or older. Diabetes Have regular diabetes screenings. This checks your fasting blood sugar level. Have the screening done:  Once every three years after age 72 if you are at a normal weight and have a low risk for diabetes.  More often and at a younger age if you are overweight or have a high risk for diabetes. What should I know about preventing infection? Hepatitis B If you have a higher risk for hepatitis B, you should be screened for this virus. Talk with your health care provider to find out if you are at risk for hepatitis B infection. Hepatitis C Testing is recommended for:  Everyone born from [birth year] through 1965.  Anyone with known risk factors for hepatitis C. Sexually transmitted infections (STIs)  Get screened for STIs, including gonorrhea and chlamydia, if: ? You are sexually active and are younger than 72 years of age. ? You are older than 72 years of age and your health care provider tells you that you are at risk for this type of infection. ? Your sexual activity has changed since you were last screened, and you are at increased risk for chlamydia or gonorrhea. Ask your health care provider if you are at risk.  Ask your health care provider about whether you are at high risk for HIV. Your health care provider may recommend a prescription medicine to help prevent HIV infection. If you choose to take medicine to prevent HIV, you should first get tested for HIV. You should then be tested every 3  months for as long as you are taking the medicine. Pregnancy  If you are about to stop having your period (premenopausal) and you may become pregnant, seek counseling before you get pregnant.  Take 400 to 800 micrograms (mcg) of folic acid every day if you become pregnant.  Ask for birth control (contraception) if you want to prevent pregnancy. Osteoporosis and menopause Osteoporosis is a disease in which the bones lose minerals and strength with aging. This can result in bone fractures. If you are 72 years old or older, or if you are at risk for osteoporosis and fractures, ask your health care provider if you should:  Be screened for bone loss.  Take a calcium or vitamin D supplement to lower your risk of fractures.  Be given hormone replacement therapy (HRT) to treat symptoms of menopause. Follow these instructions at home: Lifestyle  Do not use any products that contain nicotine or tobacco, such as cigarettes, e-cigarettes, and chewing tobacco. If you need help quitting, ask your health care provider.  Do not use street drugs.  Do not share needles.  Ask your health care provider for help if you need support or information about quitting drugs. Alcohol use  Do not drink alcohol if: ? Your health care provider tells you not to drink. ? You are pregnant, may be pregnant, or are planning to become pregnant.  If you drink alcohol: ? Limit how much you use to 0-1 drink a day. ? Limit intake if you are breastfeeding.  Be aware of how much alcohol is in your drink. In the [place] one drink equals one 12 oz bottle of beer (355 mL), one 5 oz glass of wine (148 mL), or one 1 oz glass of hard liquor (  44 mL). General instructions  Schedule regular health, dental, and eye exams.  Stay current with your vaccines.  Tell your health care provider if: ? You often feel depressed. ? You have ever been abused or do not feel safe at home. Summary  Adopting a healthy lifestyle and getting  preventive care are important in promoting health and wellness.  Follow your health care provider's instructions about healthy diet, exercising, and getting tested or screened for diseases.  Follow your health care provider's instructions on monitoring your cholesterol and blood pressure. This information is not intended to replace advice given to you by your health care provider. Make sure you discuss any questions you have with your health care provider. Document Revised: 08/27/2018 Document Reviewed: 08/27/2018 Elsevier Patient Education  2021 [employer].       [place]. Mervil Wacker M.D.

## 2021-01-04 ENCOUNTER — Other Ambulatory Visit: Payer: Self-pay

## 2021-01-04 ENCOUNTER — Ambulatory Visit (INDEPENDENT_AMBULATORY_CARE_PROVIDER_SITE_OTHER): Payer: Medicare Other

## 2021-01-04 ENCOUNTER — Encounter: Payer: Self-pay | Admitting: Internal Medicine

## 2021-01-04 ENCOUNTER — Ambulatory Visit (INDEPENDENT_AMBULATORY_CARE_PROVIDER_SITE_OTHER): Payer: Medicare Other | Admitting: Internal Medicine

## 2021-01-04 VITALS — BP 110/62 | HR 78 | Temp 97.9°F | Ht 60.0 in | Wt 124.0 lb

## 2021-01-04 DIAGNOSIS — D696 Thrombocytopenia, unspecified: Secondary | ICD-10-CM

## 2021-01-04 DIAGNOSIS — Z0000 Encounter for general adult medical examination without abnormal findings: Secondary | ICD-10-CM | POA: Diagnosis not present

## 2021-01-04 DIAGNOSIS — G43709 Chronic migraine without aura, not intractable, without status migrainosus: Secondary | ICD-10-CM | POA: Diagnosis not present

## 2021-01-04 DIAGNOSIS — M8580 Other specified disorders of bone density and structure, unspecified site: Secondary | ICD-10-CM | POA: Diagnosis not present

## 2021-01-04 DIAGNOSIS — R233 Spontaneous ecchymoses: Secondary | ICD-10-CM

## 2021-01-04 DIAGNOSIS — E785 Hyperlipidemia, unspecified: Secondary | ICD-10-CM

## 2021-01-04 DIAGNOSIS — Z79899 Other long term (current) drug therapy: Secondary | ICD-10-CM

## 2021-01-04 NOTE — Patient Instructions (Signed)
Your exam is good today .  Rechecking blood testing platelet count.  Make appt for fasting lab  .  Will notify you  of labs when available.    Health Maintenance, Female Adopting a healthy lifestyle and getting preventive care are important in promoting health and wellness. Ask your health care provider about:  The right schedule for you to have regular tests and exams.  Things you can do on your own to prevent diseases and keep yourself healthy. What should I know about diet, weight, and exercise? Eat a healthy diet  Eat a diet that includes plenty of vegetables, fruits, low-fat dairy products, and lean protein.  Do not eat a lot of foods that are high in solid fats, added sugars, or sodium.   Maintain a healthy weight Body mass index (BMI) is used to identify weight problems. It estimates body fat based on height and weight. Your health care provider can help determine your BMI and help you achieve or maintain a healthy weight. Get regular exercise Get regular exercise. This is one of the most important things you can do for your health. Most adults should:  Exercise for at least 150 minutes each week. The exercise should increase your heart rate and make you sweat (moderate-intensity exercise).  Do strengthening exercises at least twice a week. This is in addition to the moderate-intensity exercise.  Spend less time sitting. Even light physical activity can be beneficial. Watch cholesterol and blood lipids Have your blood tested for lipids and cholesterol at 72 years of age, then have this test every 5 years. Have your cholesterol levels checked more often if:  Your lipid or cholesterol levels are high.  You are older than 72 years of age.  You are at high risk for heart disease. What should I know about cancer screening? Depending on your health history and family history, you may need to have cancer screening at various ages. This may include screening for:  Breast  cancer.  Cervical cancer.  Colorectal cancer.  Skin cancer.  Lung cancer. What should I know about heart disease, diabetes, and high blood pressure? Blood pressure and heart disease  High blood pressure causes heart disease and increases the risk of stroke. This is more likely to develop in people who have high blood pressure readings, are of African descent, or are overweight.  Have your blood pressure checked: ? Every 3-5 years if you are 25-35 years of age. ? Every year if you are 33 years old or older. Diabetes Have regular diabetes screenings. This checks your fasting blood sugar level. Have the screening done:  Once every three years after age 6 if you are at a normal weight and have a low risk for diabetes.  More often and at a younger age if you are overweight or have a high risk for diabetes. What should I know about preventing infection? Hepatitis B If you have a higher risk for hepatitis B, you should be screened for this virus. Talk with your health care provider to find out if you are at risk for hepatitis B infection. Hepatitis C Testing is recommended for:  Everyone born from [birth year] through 1967.  Anyone with known risk factors for hepatitis C. Sexually transmitted infections (STIs)  Get screened for STIs, including gonorrhea and chlamydia, if: ? You are sexually active and are younger than 72 years of age. ? You are older than 72 years of age and your health care provider tells you that you are at  risk for this type of infection. ? Your sexual activity has changed since you were last screened, and you are at increased risk for chlamydia or gonorrhea. Ask your health care provider if you are at risk.  Ask your health care provider about whether you are at high risk for HIV. Your health care provider may recommend a prescription medicine to help prevent HIV infection. If you choose to take medicine to prevent HIV, you should first get tested for HIV. You should  then be tested every 3 months for as long as you are taking the medicine. Pregnancy  If you are about to stop having your period (premenopausal) and you may become pregnant, seek counseling before you get pregnant.  Take 400 to 800 micrograms (mcg) of folic acid every day if you become pregnant.  Ask for birth control (contraception) if you want to prevent pregnancy. Osteoporosis and menopause Osteoporosis is a disease in which the bones lose minerals and strength with aging. This can result in bone fractures. If you are 73 years old or older, or if you are at risk for osteoporosis and fractures, ask your health care provider if you should:  Be screened for bone loss.  Take a calcium or vitamin D supplement to lower your risk of fractures.  Be given hormone replacement therapy (HRT) to treat symptoms of menopause. Follow these instructions at home: Lifestyle  Do not use any products that contain nicotine or tobacco, such as cigarettes, e-cigarettes, and chewing tobacco. If you need help quitting, ask your health care provider.  Do not use street drugs.  Do not share needles.  Ask your health care provider for help if you need support or information about quitting drugs. Alcohol use  Do not drink alcohol if: ? Your health care provider tells you not to drink. ? You are pregnant, may be pregnant, or are planning to become pregnant.  If you drink alcohol: ? Limit how much you use to 0-1 drink a day. ? Limit intake if you are breastfeeding.  Be aware of how much alcohol is in your drink. In the [place] one drink equals one 12 oz bottle of beer (355 mL), one 5 oz glass of wine (148 mL), or one 1 oz glass of hard liquor (44 mL). General instructions  Schedule regular health, dental, and eye exams.  Stay current with your vaccines.  Tell your health care provider if: ? You often feel depressed. ? You have ever been abused or do not feel safe at home. Summary  Adopting a  healthy lifestyle and getting preventive care are important in promoting health and wellness.  Follow your health care provider's instructions about healthy diet, exercising, and getting tested or screened for diseases.  Follow your health care provider's instructions on monitoring your cholesterol and blood pressure. This information is not intended to replace advice given to you by your health care provider. Make sure you discuss any questions you have with your health care provider. Document Revised: 08/27/2018 Document Reviewed: 08/27/2018 Elsevier Patient Education  2021 [employer].

## 2021-01-04 NOTE — Progress Notes (Signed)
Subjective:   Alicia Keith is a 70 y.o. female who presents for an Initial Medicare Annual Wellness Visit.  Review of Systems    n/a Cardiac Risk Factors include: advanced age (>25men, >27 women);hypertension;dyslipidemia     Objective:    Today's Vitals   01/04/21 1407  BP: 110/62  Pulse: 78  Temp: 97.9 F (36.6 C)  SpO2: 98%  Weight: 124 lb (56.2 kg)  Height: 5' (1.524 m)   Body mass index is 24.22 kg/m.  Advanced Directives 01/04/2021  Does Patient Have a Medical Advance Directive? Yes  Type of Estate agent of [place];Living will  Does patient want to make changes to medical advance directive? No - Patient declined  Copy of Healthcare Power of Attorney in Chart? No - copy requested    Current Medications (verified) Outpatient Encounter Medications as of 01/04/2021  Medication Sig  . amitriptyline (ELAVIL) 10 MG tablet Take 1 tablet (10 mg total) by mouth at bedtime.  . baclofen (LIORESAL) 10 MG tablet TAKE 1 TABLET BY MOUTH DAILY AS NEEDED FOR MUSCLE SPASMS.  . Estradiol 10 MCG TABS vaginal tablet Place vaginally. Using twice weekly  . ibandronate (BONIVA) 150 MG tablet Take 150 mg by mouth every 30 (thirty) days. Take in the morning with a full glass of water, on an empty stomach, and do not take anything else by mouth or lie down for the next 30 min.  . rizatriptan (MAXALT) 10 MG tablet TAKE 1 TABLET BY MOUTH AS NEEDED FOR MIGRAINE. MAY REPEAT IN 2 HOURS IF NEEDED  . topiramate (TOPAMAX) 50 MG tablet Take 1 tablet (50 mg total) by mouth 2 (two) times daily.   No facility-administered encounter medications on file as of 01/04/2021.    Allergies (verified) Imipramine   History: Past Medical History:  Diagnosis Date  . Allergic rhinitis, cause unspecified   . Headaches, cluster   . History of chicken pox   . Migraine headache    Past Surgical History:  Procedure Laterality Date  . TONSILLECTOMY  1955   Family History  Problem  Relation Age of Onset  . Hyperlipidemia Mother   . Renal cancer Mother   . Hypertension Father   . Stroke Father    Social History   Socioeconomic History  . Marital status: Married    Spouse name: Not on file  . Number of children: 1  . Years of education: 78  . Highest education level: Not on file  Occupational History  . Occupation: Retired    Comment: PT  Tobacco Use  . Smoking status: Never Smoker  . Smokeless tobacco: Never Used  . Tobacco comment: married, retired PT  Substance and Sexual Activity  . Alcohol use: Yes    Comment: "Couple glasses of wine"  . Drug use: No  . Sexual activity: Not on file  Other Topics Concern  . Not on file  Social History Narrative   8 hours of sleep per night   Lives with Dr. 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 Difficulty of Paying Living Expenses: Not hard at all  Food Insecurity: No Food Insecurity  . Worried About Programme researcher, broadcasting/film/video in the Last Year: Never true  . Ran Out of Food in the Last Year: Never true  Transportation Needs: No Transportation Needs  . Lack of Transportation (Medical): No  .  Lack of Transportation (Non-Medical): No  Physical Activity: Sufficiently Active  . Days of Exercise per Week: 7 days  . Minutes of Exercise per Session: 30 min  Stress: No Stress Concern Present  . Feeling of Stress : Not at all  Social Connections: Moderately Isolated  . Frequency of Communication with Friends and Family: More than three times a week  . Frequency of Social Gatherings with Friends and Family: More than three times a week  . Attends Religious Services: Never  . Active Member of Clubs or Organizations: No  . Attends BankerClub or Organization Meetings: Never  . Marital Status: Married    Tobacco Counseling Counseling given: Not Answered Comment: married, retired PT   Clinical Intake:  Pre-visit preparation  completed: Yes  Pain : No/denies pain     Nutritional Risks: None Diabetes: No  How often do you need to have someone help you when you read instructions, pamphlets, or other written materials from your doctor or pharmacy?: 1 - Never What is the last grade level you completed in school?: college  Diabetic?no  Interpreter Needed?: No  Information entered by :: L.Daril Warga,LPN   Activities of Daily Living In your present state of health, do you have any difficulty performing the following activities: 01/04/2021  Hearing? N  Vision? N  Difficulty concentrating or making decisions? N  Walking or climbing stairs? N  Dressing or bathing? N  Doing errands, shopping? N  Preparing Food and eating ? N  Using the Toilet? N  In the past six months, have you accidently leaked urine? N  Do you have problems with loss of bowel control? N  Managing your Medications? N  Managing your Finances? N  Housekeeping or managing your Housekeeping? N  Some recent data might be hidden    Patient Care Team: Panosh, Neta MendsWanda K, MD as PCP - General (Internal Medicine) Santiago GladFreeman, Marshall, MD (Neurology) Silverio Layivard, Sandra, MD (Obstetrics and Gynecology)  Indicate any recent Medical Services you may have received from other than [facility] providers in the past year (date may be approximate).     Assessment:   This is a routine wellness examination for [place].  Hearing/Vision screen  Hearing Screening   125Hz  250Hz  500Hz  1000Hz  2000Hz  3000Hz  4000Hz  6000Hz  8000Hz   Right ear:           Left ear:           Vision Screening Comments: Annual eye exams wear glasses  Dietary issues and exercise activities discussed: Current Exercise Habits: Home exercise routine, Type of exercise: treadmill, Time (Minutes): 30, Frequency (Times/Week): 7, Weekly Exercise (Minutes/Week): 210, Intensity: Mild, Exercise limited by: None identified  Goals   None    Depression Screen PHQ 2/9 Scores 01/04/2021 03/26/2018  PHQ - 2 Score  0 0    Fall Risk Fall Risk  01/04/2021 11/04/2019 06/09/2018 03/26/2018  Falls in the past year? 0 0 No No  Number falls in past yr: 0 - - -  Injury with Fall? 0 - - -    FALL RISK PREVENTION PERTAINING TO THE HOME:  Any stairs in or around the home? Yes  If so, are there any without handrails? Yes  Home free of loose throw rugs in walkways, pet beds, electrical cords, etc? Yes  Adequate lighting in your home to reduce risk of falls? Yes   ASSISTIVE DEVICES UTILIZED TO PREVENT FALLS:  Life alert? No  Use of a cane, walker or w/c? Yes  Grab bars in the bathroom? No  Shower chair or bench in shower? No  Elevated toilet seat or a handicapped toilet? No   TIMED UP AND GO:  Was the test performed? Yes .  Length of time to ambulate 10 feet: 6 sec.   Gait steady and fast without use of assistive device  Cognitive Function:        Immunizations Immunization History  Administered Date(s) Administered  . Fluad Quad(high Dose 65+) 06/03/2019  . Influenza, High Dose Seasonal PF 06/06/2016, 05/28/2018  . Influenza-Unspecified 05/19/2015, 05/18/2016  . PFIZER(Purple Top)SARS-COV-2 Vaccination 10/31/2019, 11/22/2019  . Pneumococcal Conjugate-13 04/24/2016  . Pneumococcal Polysaccharide-23 03/26/2018  . Td 09/17/2005  . Tdap 03/26/2018  . Zoster 01/14/2012  . Zoster Recombinat (Shingrix) 03/26/2018, 05/28/2018    TDAP status: Up to date  Flu Vaccine status: Up to date  Pneumococcal vaccine status: Up to date  Covid-19 vaccine status: Completed vaccines  Qualifies for Shingles Vaccine? Yes   Zostavax completed No   Shingrix Completed?: Yes  Screening Tests Health Maintenance  Topic Date Due  . DEXA SCAN  Never done  . MAMMOGRAM  08/18/2020  . INFLUENZA VACCINE  04/17/2021  . COLONOSCOPY (Pts 45-54yrs Insurance coverage will need to be confirmed)  01/18/2025  . TETANUS/TDAP  03/26/2028  . COVID-19 Vaccine  Completed  . Hepatitis C Screening  Completed  . PNA vac Low  Risk Adult  Completed  . HPV VACCINES  Aged Out    Health Maintenance  Health Maintenance Due  Topic Date Due  . DEXA SCAN  Never done  . MAMMOGRAM  08/18/2020    Colorectal cancer screening: Type of screening: Colonoscopy. Completed 01/19/2015. Repeat every 10 years  Mammogram status: Completed 09/2020. Repeat every year  Bone Density status: Completed 09/2020. Results reflect: Bone density results: OSTEOPENIA. Repeat every 5 years.  Lung Cancer Screening: (Low Dose CT Chest recommended if Age 22-80 years, 30 pack-year currently smoking OR have quit w/in 15years.) does not qualify.   Lung Cancer Screening Referral: n/a  Additional Screening:  Hepatitis C Screening: does qualify; Completed 0710/2019   Vision Screening: Recommended annual ophthalmology exams for early detection of glaucoma and other disorders of the eye. Is the patient up to date with their annual eye exam?  Yes  Who is the provider or what is the name of the office in which the patient attends annual eye exams? Dr. Caryn Section If pt is not established with a provider, would they like to be referred to a provider to establish care? No .   Dental Screening: Recommended annual dental exams for proper oral hygiene  Community Resource Referral / Chronic Care Management: CRR required this visit?  No   CCM required this visit?  No      Plan:     I have personally reviewed and noted the following in the patient's chart:   . Medical and social history . Use of alcohol, tobacco or illicit drugs  . Current medications and supplements . Functional ability and status . Nutritional status . Physical activity . Advanced directives . List of other physicians . Hospitalizations, surgeries, and ER visits in previous 12 months . Vitals . Screenings to include cognitive, depression, and falls . Referrals and appointments  In addition, I have reviewed and discussed with patient certain preventive protocols, quality  metrics, and best practice recommendations. A written personalized care plan for preventive services as well as general preventive health recommendations were provided to patient.     March Rummage, LPN   03/19/5008  Nurse Notes: none

## 2021-01-04 NOTE — Patient Instructions (Addendum)
Alicia Keith , Thank you for taking time to come for your Medicare Wellness Visit. I appreciate your ongoing commitment to your health goals. Please review the following plan we discussed and let me know if I can assist you in the future.   Screening recommendations/referrals: Colonoscopy: current due 01/18/2025 Mammogram:current due  09/2021  Dr.Rivard Bone Density: current due 09/2022  Dr. Estanislado Pandy  Recommended yearly ophthalmology/optometry visit for glaucoma screening and checkup Recommended yearly dental visit for hygiene and checkup  Vaccinations: Influenza vaccine: current due fall 2022 Pneumococcal vaccine: completed series  Tdap vaccine: current 03/26/2028 Shingles vaccine: Completed series   Advanced directives: will provide co[pies   Conditions/risks identified: none   Next appointment: Today 01/04/2021 @300  with Dr.Panosh   Preventive Care 65 Years and Older, Female Preventive care refers to lifestyle choices and visits with your health care provider that can promote health and wellness. What does preventive care include?  A yearly physical exam. This is also called an annual well check.  Dental exams once or twice a year.  Routine eye exams. Ask your health care provider how often you should have your eyes checked.  Personal lifestyle choices, including:  Daily care of your teeth and gums.  Regular physical activity.  Eating a healthy diet.  Avoiding tobacco and drug use.  Limiting alcohol use.  Practicing safe sex.  Taking low-dose aspirin every day.  Taking vitamin and mineral supplements as recommended by your health care provider. What happens during an annual well check? The services and screenings done by your health care provider during your annual well check will depend on your age, overall health, lifestyle risk factors, and family history of disease. Counseling  Your health care provider may ask you questions about your:  Alcohol  use.  Tobacco use.  Drug use.  Emotional well-being.  Home and relationship well-being.  Sexual activity.  Eating habits.  History of falls.  Memory and ability to understand (cognition).  Work and work .  Reproductive health. Screening  You may have the following tests or measurements:  Height, weight, and BMI.  Blood pressure.  Lipid and cholesterol levels. These may be checked every 5 years, or more frequently if you are over 56 years old.  Skin check.  Lung cancer screening. You may have this screening every year starting at age 10 if you have a 30-pack-year history of smoking and currently smoke or have quit within the past 15 years.  Fecal occult blood test (FOBT) of the stool. You may have this test every year starting at age 45.  Flexible sigmoidoscopy or colonoscopy. You may have a sigmoidoscopy every 5 years or a colonoscopy every 10 years starting at age 37.  Hepatitis C blood test.  Hepatitis B blood test.  Sexually transmitted disease (STD) testing.  Diabetes screening. This is done by checking your blood sugar (glucose) after you have not eaten for a while (fasting). You may have this done every 1-3 years.  Bone density scan. This is done to screen for osteoporosis. You may have this done starting at age 71.  Mammogram. This may be done every 1-2 years. Talk to your health care provider about how often you should have regular mammograms. Talk with your health care provider about your test results, treatment options, and if necessary, the need for more tests. Vaccines  Your health care provider may recommend certain vaccines, such as:  Influenza vaccine. This is recommended every year.  Tetanus, diphtheria, and acellular pertussis (Tdap, Td) vaccine.  You may need a Td booster every 10 years.  Zoster vaccine. You may need this after age 29.  Pneumococcal 13-valent conjugate (PCV13) vaccine. One dose is recommended after age  37.  Pneumococcal polysaccharide (PPSV23) vaccine. One dose is recommended after age 102. Talk to your health care provider about which screenings and vaccines you need and how often you need them. This information is not intended to replace advice given to you by your health care provider. Make sure you discuss any questions you have with your health care provider. Document Released: 09/30/2015 Document Revised: 05/23/2016 Document Reviewed: 07/05/2015 Elsevier Interactive Patient Education  2017 [place] Prevention in the Home Falls can cause injuries. They can happen to people of all ages. There are many things you can do to make your home safe and to help prevent falls. What can I do on the outside of my home?  Regularly fix the edges of walkways and driveways and fix any cracks.  Remove anything that might make you trip as you walk through a door, such as a raised step or threshold.  Trim any bushes or trees on the path to your home.  Use bright outdoor lighting.  Clear any walking paths of anything that might make someone trip, such as rocks or tools.  Regularly check to see if handrails are loose or broken. Make sure that both sides of any steps have handrails.  Any raised decks and porches should have guardrails on the edges.  Have any leaves, snow, or ice cleared regularly.  Use sand or salt on walking paths during winter.  Clean up any spills in your garage right away. This includes oil or grease spills. What can I do in the bathroom?  Use night lights.  Install grab bars by the toilet and in the tub and shower. Do not use towel bars as grab bars.  Use non-skid mats or decals in the tub or shower.  If you need to sit down in the shower, use a plastic, non-slip stool.  Keep the floor dry. Clean up any water that spills on the floor as soon as it happens.  Remove soap buildup in the tub or shower regularly.  Attach bath mats securely with double-sided  non-slip rug tape.  Do not have throw rugs and other things on the floor that can make you trip. What can I do in the bedroom?  Use night lights.  Make sure that you have a light by your bed that is easy to reach.  Do not use any sheets or blankets that are too big for your bed. They should not hang down onto the floor.  Have a firm chair that has side arms. You can use this for support while you get dressed.  Do not have throw rugs and other things on the floor that can make you trip. What can I do in the kitchen?  Clean up any spills right away.  Avoid walking on wet floors.  Keep items that you use a lot in easy-to-reach places.  If you need to reach something above you, use a strong step stool that has a grab bar.  Keep electrical cords out of the way.  Do not use floor polish or wax that makes floors slippery. If you must use wax, use non-skid floor wax.  Do not have throw rugs and other things on the floor that can make you trip. What can I do with my stairs?  Do not leave any  items on the stairs.  Make sure that there are handrails on both sides of the stairs and use them. Fix handrails that are broken or loose. Make sure that handrails are as long as the stairways.  Check any carpeting to make sure that it is firmly attached to the stairs. Fix any carpet that is loose or worn.  Avoid having throw rugs at the top or bottom of the stairs. If you do have throw rugs, attach them to the floor with carpet tape.  Make sure that you have a light switch at the top of the stairs and the bottom of the stairs. If you do not have them, ask someone to add them for you. What else can I do to help prevent falls?  Wear shoes that:  Do not have high heels.  Have rubber bottoms.  Are comfortable and fit you well.  Are closed at the toe. Do not wear sandals.  If you use a stepladder:  Make sure that it is fully opened. Do not climb a closed stepladder.  Make sure that both  sides of the stepladder are locked into place.  Ask someone to hold it for you, if possible.  Clearly mark and make sure that you can see:  Any grab bars or handrails.  First and last steps.  Where the edge of each step is.  Use tools that help you move around (mobility aids) if they are needed. These include:  Canes.  Walkers.  Scooters.  Crutches.  Turn on the lights when you go into a dark area. Replace any light bulbs as soon as they burn out.  Set up your furniture so you have a clear path. Avoid moving your furniture around.  If any of your floors are uneven, fix them.  If there are any pets around you, be aware of where they are.  Review your medicines with your doctor. Some medicines can make you feel dizzy. This can increase your chance of falling. Ask your doctor what other things that you can do to help prevent falls. This information is not intended to replace advice given to you by your health care provider. Make sure you discuss any questions you have with your health care provider. Document Released: 06/30/2009 Document Revised: 02/09/2016 Document Reviewed: 10/08/2014 Elsevier Interactive Patient Education  2017 [employer].

## 2021-01-06 ENCOUNTER — Other Ambulatory Visit: Payer: Self-pay

## 2021-01-06 ENCOUNTER — Other Ambulatory Visit (INDEPENDENT_AMBULATORY_CARE_PROVIDER_SITE_OTHER): Payer: Medicare Other

## 2021-01-06 DIAGNOSIS — Z79899 Other long term (current) drug therapy: Secondary | ICD-10-CM | POA: Diagnosis not present

## 2021-01-06 DIAGNOSIS — G43709 Chronic migraine without aura, not intractable, without status migrainosus: Secondary | ICD-10-CM

## 2021-01-06 DIAGNOSIS — M8580 Other specified disorders of bone density and structure, unspecified site: Secondary | ICD-10-CM | POA: Diagnosis not present

## 2021-01-06 DIAGNOSIS — D649 Anemia, unspecified: Secondary | ICD-10-CM

## 2021-01-06 DIAGNOSIS — D696 Thrombocytopenia, unspecified: Secondary | ICD-10-CM

## 2021-01-06 DIAGNOSIS — R233 Spontaneous ecchymoses: Secondary | ICD-10-CM

## 2021-01-06 DIAGNOSIS — E785 Hyperlipidemia, unspecified: Secondary | ICD-10-CM

## 2021-01-06 LAB — CBC WITH DIFFERENTIAL/PLATELET
Basophils Absolute: 0.1 10*3/uL (ref 0.0–0.1)
Basophils Relative: 1.3 % (ref 0.0–3.0)
Eosinophils Absolute: 0.2 10*3/uL (ref 0.0–0.7)
Eosinophils Relative: 3.1 % (ref 0.0–5.0)
HCT: 35.1 % — ABNORMAL LOW (ref 36.0–46.0)
Hemoglobin: 11.6 g/dL — ABNORMAL LOW (ref 12.0–15.0)
Lymphocytes Relative: 24.6 % (ref 12.0–46.0)
Lymphs Abs: 1.3 10*3/uL (ref 0.7–4.0)
MCHC: 33.2 g/dL (ref 30.0–36.0)
MCV: 83 fl (ref 78.0–100.0)
Monocytes Absolute: 0.5 10*3/uL (ref 0.1–1.0)
Monocytes Relative: 9.3 % (ref 3.0–12.0)
Neutro Abs: 3.4 10*3/uL (ref 1.4–7.7)
Neutrophils Relative %: 61.7 % (ref 43.0–77.0)
Platelets: 167 10*3/uL (ref 150.0–400.0)
RBC: 4.23 Mil/uL (ref 3.87–5.11)
RDW: 14.6 % (ref 11.5–15.5)
WBC: 5.5 10*3/uL (ref 4.0–10.5)

## 2021-01-06 LAB — HEPATIC FUNCTION PANEL
ALT: 14 U/L (ref 0–35)
AST: 18 U/L (ref 0–37)
Albumin: 3.8 g/dL (ref 3.5–5.2)
Alkaline Phosphatase: 47 U/L (ref 39–117)
Bilirubin, Direct: 0.1 mg/dL (ref 0.0–0.3)
Total Bilirubin: 0.4 mg/dL (ref 0.2–1.2)
Total Protein: 6.3 g/dL (ref 6.0–8.3)

## 2021-01-06 LAB — LIPID PANEL
Cholesterol: 214 mg/dL — ABNORMAL HIGH (ref 0–200)
HDL: 62.3 mg/dL (ref >39.00)
LDL Cholesterol: 127 mg/dL — ABNORMAL HIGH (ref 0–99)
NonHDL: 151.25
Total CHOL/HDL Ratio: 3
Triglycerides: 123 mg/dL (ref 0.0–149.0)
VLDL: 24.6 mg/dL (ref 0.0–40.0)

## 2021-01-06 LAB — C-REACTIVE PROTEIN: CRP: <1 mg/dL (ref 0.5–20.0)

## 2021-01-06 LAB — BASIC METABOLIC PANEL
BUN: 26 mg/dL — ABNORMAL HIGH (ref 6–23)
CO2: 27 mEq/L (ref 19–32)
Calcium: 9 mg/dL (ref 8.4–10.5)
Chloride: 106 mEq/L (ref 96–112)
Creatinine, Ser: 0.88 mg/dL (ref 0.40–1.20)
GFR: 66.02 mL/min (ref >60.00)
Glucose, Bld: 95 mg/dL (ref 70–99)
Potassium: 4 mEq/L (ref 3.5–5.1)
Sodium: 140 mEq/L (ref 135–145)

## 2021-01-06 LAB — TSH: TSH: 2.89 u[IU]/mL (ref 0.35–4.50)

## 2021-01-10 ENCOUNTER — Other Ambulatory Visit: Payer: Self-pay | Admitting: Internal Medicine

## 2021-01-10 ENCOUNTER — Other Ambulatory Visit: Payer: Self-pay

## 2021-01-10 DIAGNOSIS — D649 Anemia, unspecified: Secondary | ICD-10-CM

## 2021-01-10 NOTE — Addendum Note (Signed)
Addended by: Christy Sartorius on: 01/10/2021 01:30 PM   Modules accepted: Orders

## 2021-01-10 NOTE — Addendum Note (Signed)
Addended by: Christy Sartorius on: 01/10/2021 01:32 PM   Modules accepted: Orders

## 2021-01-10 NOTE — Progress Notes (Signed)
Platelet count back to normal  mild anemia .     I did a record review after you left, and noted some loose ends : Cx-ray 2017 showed incidental calcification in the wall of the aortic arch.  And prominent interstitial markings.  You could consider statin medication intervention or  not  , Option to get coronary artery calcium score  ( 99$ out of pocket  ) if want further risk assessment to decide .  Did you ever get pulmonary function tests(? dont have to if you feel  your  breathing is fine )   Either way  repeat cbcdiff  w  ferritin ibc in 4-6 months  dx mild anemia

## 2021-01-10 NOTE — Addendum Note (Signed)
Addended by: Christy Sartorius on: 01/10/2021 02:13 PM   Modules accepted: Orders

## 2021-01-18 NOTE — Progress Notes (Signed)
So fine with patient preference .  Will follow

## 2021-01-31 ENCOUNTER — Other Ambulatory Visit (HOSPITAL_BASED_OUTPATIENT_CLINIC_OR_DEPARTMENT_OTHER): Payer: Self-pay

## 2021-01-31 ENCOUNTER — Ambulatory Visit: Payer: Medicare Other | Attending: Internal Medicine

## 2021-01-31 ENCOUNTER — Other Ambulatory Visit: Payer: Self-pay

## 2021-01-31 DIAGNOSIS — Z23 Encounter for immunization: Secondary | ICD-10-CM

## 2021-01-31 MED ORDER — PFIZER-BIONT COVID-19 VAC-TRIS 30 MCG/0.3ML IM SUSP
INTRAMUSCULAR | 0 refills | Status: DC
  Filled 2021-01-31: qty 0.3, 1d supply, fill #0

## 2021-01-31 NOTE — Progress Notes (Signed)
   Covid-19 Vaccination Clinic  Name:  Alicia Keith    MRN: [id number] DOB: 1951-07-03  01/31/2021  [name] was observed post Covid-19 immunization for 15 minutes without incident. She was provided with Vaccine Information Sheet and instruction to access the V-Safe system.   [name] was instructed to call 911 with any severe reactions post vaccine: Marland Kitchen Difficulty breathing  . Swelling of face and throat  . A fast heartbeat  . A bad rash all over body  . Dizziness and weakness   Immunizations Administered    Name Date Dose VIS Date Route   PFIZER Comrnaty(Gray TOP) Covid-19 Vaccine 01/31/2021  1:05 PM 0.3 mL 08/25/2020 Intramuscular   Manufacturer: [employer], [employer]   Lot: [id number]   NDC: [phone]

## 2021-04-03 ENCOUNTER — Encounter: Payer: Self-pay | Admitting: Adult Health

## 2021-04-04 NOTE — Telephone Encounter (Signed)
Alicia Keith please verify with the patient that she has seen her PCP for her heart rate and rhythm being irregular?  If not she should start with her PCP.  Certainly amitriptyline has side effects that could cause this but we will need to verify what the issue is first?  Switching to another medication in the same drug class will potentially have the same side effects

## 2021-04-05 NOTE — Telephone Encounter (Signed)
Okay to stop amitriptyline-try this for approximately 2 weeks and see if cardiac symptoms resolve.  If so and the headaches return then we will consider another medication and another drug class.  If the symptoms do not resolve and was likely not related to amitriptyline

## 2021-04-11 ENCOUNTER — Other Ambulatory Visit: Payer: Self-pay | Admitting: Adult Health

## 2021-05-30 ENCOUNTER — Telehealth: Payer: Self-pay | Admitting: Neurology

## 2021-05-30 NOTE — Telephone Encounter (Signed)
error 

## 2021-05-31 ENCOUNTER — Ambulatory Visit (INDEPENDENT_AMBULATORY_CARE_PROVIDER_SITE_OTHER): Payer: Medicare Other | Admitting: Adult Health

## 2021-05-31 ENCOUNTER — Encounter: Payer: Self-pay | Admitting: Adult Health

## 2021-05-31 ENCOUNTER — Ambulatory Visit: Payer: Medicare Other | Admitting: Neurology

## 2021-05-31 ENCOUNTER — Other Ambulatory Visit: Payer: Self-pay

## 2021-05-31 VITALS — BP 113/75 | HR 85 | Ht 60.0 in | Wt 116.0 lb

## 2021-05-31 DIAGNOSIS — G43011 Migraine without aura, intractable, with status migrainosus: Secondary | ICD-10-CM

## 2021-05-31 MED ORDER — NORTRIPTYLINE HCL 10 MG PO CAPS: 10.0000 mg | ORAL_CAPSULE | Freq: Every day | ORAL | 3 refills | Status: DC

## 2021-05-31 NOTE — Progress Notes (Signed)
PATIENT: Alicia Keith DOB: 1951/02/05  REASON FOR VISIT: follow up HISTORY FROM: patient  HISTORY OF PRESENT ILLNESS: Today 05/31/21:  Alicia Keith is a 70 year old female with a history of migraine headaches and anxiety.  She returns today for follow-up.  She reports that amitriptyline worked well for her.  She states that she did not have any headaches that she felt the best that she is felt in a long time however after 3 to 4 months she began to have what she describes as heart palpitation.  She did stop the medication and the symptoms resolved.  However her headaches have returned.  She states that she has almost a mild headache on a daily basis.  She typically will use Advil or baclofen.  For more severe headache she will use the Maxalt.  States that she gets 2-3 intense headaches a month.  Continues on Topamax 50 mg twice a day  11/29/20: Alicia Keith is a 70 year old female with a history of migraine headaches and anxiety.  She returns today for follow-up.  She reports that she continues to have daily headaches.  But she only has 2-3 migraines a month.  She continues on Topamax 50 mg twice a day.  She reports that with severe headache she does take rizatriptan with good benefit.  The patient has tried multiple medications in the past for her migraines including CGRP agents, Botox and several oral medication.  The patient is currently happy with her migraine management.  At the last visit with Dr. Vickey Huger she was switched from Zoloft to Prozac.  Reports that since she started Prozac she has been getting grinding her teeth at night.  She would like to switch to another medication.  She mentions that she would like to try Celexa.  She also mentions amitriptyline or nortriptyline although she has tried this before in the past.  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  She did have status migrainosus many many times in her life but her current level of migraine frequency and intensity has been better controlled.  She has responded well to Ajovy but then the effect wore off.  In my last visit she started Zoloft but she has developed a "aura and there is a suspicion that this may be related to the SSRI sertraline.  We are discussing today to change her to Prozac which should be used at 20 mg daily does not require any kind of washout.   By now she has failed Botox, all triptans, Ajovy, and only Bernita Raisin has not been tries.      REVIEW OF SYSTEMS: Out of a complete 14 system review of symptoms, the patient complains only of the following symptoms, and all other reviewed systems are negative.  ALLERGIES: Allergies  Allergen Reactions   Amitriptyline     palpitations   Imipramine Rash    HOME MEDICATIONS: Outpatient Medications Prior to Visit  Medication Sig Dispense Refill   baclofen (LIORESAL) 10 MG tablet TAKE 1 TABLET BY MOUTH DAILY AS NEEDED FOR MUSCLE SPASMS. 90 each 1   Estradiol 10 MCG TABS vaginal tablet Place vaginally. Using twice weekly     ibandronate (BONIVA) 150 MG tablet Take 150 mg by mouth every 30 (thirty) days. Take in the morning with a full glass of water, on an empty stomach, and do not take anything else by  mouth or lie down for the next 30 min.     rizatriptan (MAXALT) 10 MG tablet TAKE 1 TABLET BY MOUTH AS NEEDED FOR MIGRAINE. MAY REPEAT IN 2 HOURS IF NEEDED 12 tablet 5   topiramate (TOPAMAX) 50 MG tablet Take 1 tablet (50 mg total) by mouth 2 (two) times daily. 180 tablet 3   amitriptyline (ELAVIL) 10 MG tablet Take 1 tablet (10 mg total) by mouth at bedtime. 30 tablet 3   COVID-19 mRNA Vac-TriS, Pfizer, (PFIZER-BIONT COVID-19 VAC-TRIS) SUSP injection Inject into the muscle. 0.3 mL 0   No facility-administered medications prior to visit.    PAST MEDICAL HISTORY: Past Medical History:   Diagnosis Date   Allergic rhinitis, cause unspecified    Headaches, cluster    History of chicken pox    Migraine headache     PAST SURGICAL HISTORY: Past Surgical History:  Procedure Laterality Date   TONSILLECTOMY  1955    FAMILY HISTORY: Family History  Problem Relation Age of Onset   Hyperlipidemia Mother    Renal cancer Mother    Hypertension Father    Stroke Father     SOCIAL HISTORY: Social History   Socioeconomic History   Marital status: Married    Spouse name: Not on file   Number of children: 1   Years of education: 17   Highest education level: Not on file  Occupational History   Occupation: Retired    Comment: PT  Tobacco Use   Smoking status: Never   Smokeless tobacco: Never   Tobacco comments:    married, retired PT  Substance and Sexual Activity   Alcohol use: Yes    Comment: "Couple glasses of wine"   Drug use: No   Sexual activity: Not on file  Other Topics Concern   Not on file  Social History Narrative   8 hours of sleep per night   Lives with Dr. 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    Pet cat   Social Determinants of Health   Financial Resource Strain: Low Risk    Difficulty of Paying Living Expenses: Not hard at all  Food Insecurity: No Food Insecurity   Worried About Programme researcher, broadcasting/film/video in the Last Year: Never true   Barista in the Last Year: Never true  Transportation Needs: No Transportation Needs   Lack of Transportation (Medical): No   Lack of Transportation (Non-Medical): No  Physical Activity: Sufficiently Active   Days of Exercise per Week: 7 days   Minutes of Exercise per Session: 30 min  Stress: No Stress Concern Present   Feeling of Stress : Not at all  Social Connections: Moderately Isolated   Frequency of Communication with Friends and Family: More than three times a week   Frequency of Social Gatherings with Friends and Family: More than three times a week   Attends Religious Services:  Never   Database administrator or Organizations: No   Attends Banker Meetings: Never   Marital Status: Married  Catering manager Violence: Not At Risk   Fear of Current or Ex-Partner: No   Emotionally Abused: No   Physically Abused: No   Sexually Abused: No      PHYSICAL EXAM  Vitals:   05/31/21 1131  BP: 113/75  Pulse: 85  Weight: 116 lb (52.6 kg)  Height: 5' (1.524 m)   Body mass index is 22.65 kg/m.  Generalized: Well developed, in no acute distress  Neurological examination  Mentation: Alert oriented to time, place, history taking. Follows all commands speech and language fluent Cranial nerve II-XII: Pupils were equal round reactive to light. Extraocular movements were full, visual field were full on confrontational test. Head turning and shoulder shrug  were normal and symmetric. Motor: The motor testing reveals 5 over 5 strength of all 4 extremities. Good symmetric motor tone is noted throughout.  Sensory: Sensory testing is intact to soft touch on all 4 extremities. No evidence of extinction is noted.  Coordination: Cerebellar testing reveals good finger-nose-finger and heel-to-shin bilaterally.  Gait and station: Gait is normal.   DIAGNOSTIC DATA (LABS, IMAGING, TESTING) - I reviewed patient records, labs, notes, testing and imaging myself where available.  Lab Results  Component Value Date   WBC 5.5 01/06/2021   HGB 11.6 (L) 01/06/2021   HCT 35.1 (L) 01/06/2021   MCV 83.0 01/06/2021   PLT 167.0 01/06/2021      Component Value Date/Time   NA 140 01/06/2021 0851   NA 142 03/12/2017 1346   K 4.0 01/06/2021 0851   CL 106 01/06/2021 0851   CO2 27 01/06/2021 0851   GLUCOSE 95 01/06/2021 0851   BUN 26 (H) 01/06/2021 0851   BUN 20 03/12/2017 1346   CREATININE 0.88 01/06/2021 0851   CALCIUM 9.0 01/06/2021 0851   PROT 6.3 01/06/2021 0851   PROT 6.7 03/12/2017 1346   ALBUMIN 3.8 01/06/2021 0851   ALBUMIN 4.2 03/12/2017 1346   AST 18  01/06/2021 0851   ALT 14 01/06/2021 0851   ALKPHOS 47 01/06/2021 0851   BILITOT 0.4 01/06/2021 0851   BILITOT <0.2 03/12/2017 1346   GFRNONAA 68 03/12/2017 1346   GFRAA 79 03/12/2017 1346   Lab Results  Component Value Date   CHOL 214 (H) 01/06/2021   HDL 62.30 01/06/2021   LDLCALC 127 (H) 01/06/2021   TRIG 123.0 01/06/2021   CHOLHDL 3 01/06/2021    Lab Results  Component Value Date   TSH 2.89 01/06/2021      ASSESSMENT AND PLAN 70 y.o. year old female  has a past medical history of Allergic rhinitis, cause unspecified, Headaches, cluster, History of chicken pox, and Migraine headache. here with;  1.  Migraine headaches  --Continue Topamax 50 mg twice a day for prevention --Patient request to try nortriptyline.  Order sent for 10 mg at bedtime.  Advised that if she begins have side effects such as heart palpitations she can stop the medication and make [place] aware. --Continue rizatriptan for abortive therapy    She will follow-up in 6 months or sooner if needed    Butch Penny, MSN, NP-C 05/31/2021, 11:42 AM [facility] [address] [place], [place] [address] [phone]

## 2021-05-31 NOTE — Patient Instructions (Signed)
Your Plan:  Try Nortriptyline 10 mg at bedtime If your symptoms worsen or you develop new symptoms please let us know.   Thank you for coming to see us at [facility]. I hope we have been able to provide you high quality care today.  You may receive a patient satisfaction survey over the next few weeks. We would appreciate your feedback and comments so that we may continue to improve ourselves and the health of our patients.    

## 2021-06-30 ENCOUNTER — Other Ambulatory Visit (HOSPITAL_BASED_OUTPATIENT_CLINIC_OR_DEPARTMENT_OTHER): Payer: Self-pay

## 2021-06-30 ENCOUNTER — Ambulatory Visit: Payer: Medicare Other | Attending: Internal Medicine

## 2021-06-30 ENCOUNTER — Other Ambulatory Visit: Payer: Self-pay

## 2021-06-30 DIAGNOSIS — Z23 Encounter for immunization: Secondary | ICD-10-CM

## 2021-06-30 MED ORDER — FLUAD QUADRIVALENT 0.5 ML IM PRSY
PREFILLED_SYRINGE | INTRAMUSCULAR | 0 refills | Status: AC
  Filled 2021-06-30 (×3): qty 0.5, 1d supply, fill #0

## 2021-06-30 MED ORDER — PFIZER COVID-19 VAC BIVALENT 30 MCG/0.3ML IM SUSP
INTRAMUSCULAR | 0 refills | Status: AC
  Filled 2021-06-30: qty 0.3, 1d supply, fill #0

## 2021-06-30 NOTE — Progress Notes (Signed)
   Covid-19 Vaccination Clinic  Name:  Alicia Keith    MRN: [id number] DOB: 1951-09-17  06/30/2021  Alicia Keith was observed post Covid-19 immunization for 15 minutes without incident. She was provided with Vaccine Information Sheet and instruction to access the V-Safe system.   Alicia Keith was instructed to call 911 with any severe reactions post vaccine: Difficulty breathing  Swelling of face and throat  A fast heartbeat  A bad rash all over body  Dizziness and weakness

## 2021-08-02 ENCOUNTER — Other Ambulatory Visit: Payer: Self-pay | Admitting: Neurology

## 2021-08-22 ENCOUNTER — Encounter: Payer: Self-pay | Admitting: Adult Health

## 2021-08-24 ENCOUNTER — Other Ambulatory Visit: Payer: Self-pay | Admitting: Adult Health

## 2021-08-24 MED ORDER — FLUOXETINE HCL 10 MG PO CAPS: 10.0000 mg | ORAL_CAPSULE | Freq: Every day | ORAL | 3 refills | Status: DC

## 2021-08-24 MED ORDER — TOPIRAMATE 50 MG PO TABS: 50.0000 mg | ORAL_TABLET | Freq: Two times a day (BID) | ORAL | 3 refills | Status: DC

## 2021-08-24 NOTE — Progress Notes (Signed)
Dr. Vickey Huger previously ordered prozac for the patient. Will reorder lower dose. 10 mg daily.

## 2021-08-25 ENCOUNTER — Other Ambulatory Visit (INDEPENDENT_AMBULATORY_CARE_PROVIDER_SITE_OTHER): Payer: Medicare Other

## 2021-08-25 DIAGNOSIS — D649 Anemia, unspecified: Secondary | ICD-10-CM

## 2021-08-25 LAB — CBC WITH DIFFERENTIAL/PLATELET
Basophils Absolute: 0.2 10*3/uL — ABNORMAL HIGH (ref 0.0–0.1)
Basophils Relative: 2.4 % (ref 0.0–3.0)
Eosinophils Absolute: 0.2 10*3/uL (ref 0.0–0.7)
Eosinophils Relative: 2.7 % (ref 0.0–5.0)
HCT: 34.9 % — ABNORMAL LOW (ref 36.0–46.0)
Hemoglobin: 11.3 g/dL — ABNORMAL LOW (ref 12.0–15.0)
Lymphocytes Relative: 25 % (ref 12.0–46.0)
Lymphs Abs: 1.6 10*3/uL (ref 0.7–4.0)
MCHC: 32.2 g/dL (ref 30.0–36.0)
MCV: 82.4 fl (ref 78.0–100.0)
Monocytes Absolute: 0.6 10*3/uL (ref 0.1–1.0)
Monocytes Relative: 9.1 % (ref 3.0–12.0)
Neutro Abs: 3.9 10*3/uL (ref 1.4–7.7)
Neutrophils Relative %: 60.8 % (ref 43.0–77.0)
Platelets: 160 10*3/uL (ref 150.0–400.0)
RBC: 4.24 Mil/uL (ref 3.87–5.11)
RDW: 14.7 % (ref 11.5–15.5)
WBC: 6.4 10*3/uL (ref 4.0–10.5)

## 2021-08-25 LAB — IBC + FERRITIN
Ferritin: 7.7 ng/mL — ABNORMAL LOW (ref 10.0–291.0)
Iron: 49 ug/dL (ref 42–145)
Saturation Ratios: 11.4 % — ABNORMAL LOW (ref 20.0–50.0)
TIBC: 428.4 ug/dL (ref 250.0–450.0)
Transferrin: 306 mg/dL (ref 212.0–360.0)

## 2021-09-03 NOTE — Progress Notes (Signed)
Mild anemia persists with low iron levels .   Platelet count normal.  Make a fu visit virtual ok to discuss follow up.

## 2021-09-21 ENCOUNTER — Other Ambulatory Visit: Payer: Self-pay

## 2021-09-21 ENCOUNTER — Telehealth (INDEPENDENT_AMBULATORY_CARE_PROVIDER_SITE_OTHER): Payer: Medicare Other | Admitting: Internal Medicine

## 2021-09-21 ENCOUNTER — Encounter: Payer: Self-pay | Admitting: Internal Medicine

## 2021-09-21 VITALS — BP 119/83 | HR 90 | Wt 115.0 lb

## 2021-09-21 DIAGNOSIS — E785 Hyperlipidemia, unspecified: Secondary | ICD-10-CM | POA: Diagnosis not present

## 2021-09-21 DIAGNOSIS — D509 Iron deficiency anemia, unspecified: Secondary | ICD-10-CM

## 2021-09-21 DIAGNOSIS — Z9189 Other specified personal risk factors, not elsewhere classified: Secondary | ICD-10-CM | POA: Diagnosis not present

## 2021-09-21 NOTE — Progress Notes (Signed)
Virtual Visit via Video Note  I connected with Alicia Keith  on 09/21/21 at 10:00 AM EST by a video enabled telemedicine application and verified that I am speaking with the correct person using two identifiers. Location patient: home Location provider: home office Persons participating in the virtual visit: patient, provider  [place] national recommendations  regarding COVID 19 pandemic   video visit is advised over in office visit for this patient.  Patient aware  of the limitations of evaluation and management by telemedicine and  availability of in person appointments. and agreed to proceed.   HPI: Alicia Keith presents for video visit for follow-up and lab results. Has had slight anemia the last 2 lab test with low iron saturation and ferritin. She feels well new GI symptoms is not on any anti-inflammatory. States that she has a remote history of borderline anemia Last colonoscopy 2016 reports she may have had a small area of bleeding from anti-inflammatory from orthopedics.  No blood in stool change in bowel habits other change in health She is under care for her headaches from Dr. Everlena Cooper neurology.   She is also interested in getting a self-pay coronary artery calcium scan as we discussed at her last visit consideration of statin medicine. ROS: See pertinent positives and negatives per HPI. No current bruising petechia  Past Medical History:  Diagnosis Date   Allergic rhinitis, cause unspecified    Headaches, cluster    History of chicken pox    Migraine headache     Past Surgical History:  Procedure Laterality Date   TONSILLECTOMY  1955    Family History  Problem Relation Age of Onset   Hyperlipidemia Mother    Renal cancer Mother    Hypertension Father    Stroke Father     Social History   Tobacco Use   Smoking status: Never   Smokeless tobacco: Never   Tobacco comments:    married, retired PT  Substance Use Topics   Alcohol use: Yes     Comment: "Couple glasses of wine"   Drug use: No      Current Outpatient Medications:    baclofen (LIORESAL) 10 MG tablet, TAKE 1 TABLET BY MOUTH DAILY AS NEEDED FOR MUSCLE SPASMS., Disp: 90 each, Rfl: 1   COVID-19 mRNA bivalent vaccine, Pfizer, (PFIZER COVID-19 VAC BIVALENT) injection, Inject into the muscle., Disp: 0.3 mL, Rfl: 0   Estradiol 10 MCG TABS vaginal tablet, Place vaginally. Using twice weekly, Disp: , Rfl:    FLUoxetine (PROZAC) 10 MG capsule, Take 1 capsule (10 mg total) by mouth daily., Disp: 30 capsule, Rfl: 3   ibandronate (BONIVA) 150 MG tablet, Take 150 mg by mouth every 30 (thirty) days. Take in the morning with a full glass of water, on an empty stomach, and do not take anything else by mouth or lie down for the next 30 min., Disp: , Rfl:    influenza vaccine adjuvanted (FLUAD QUADRIVALENT) 0.5 ML injection, Inject into the muscle., Disp: 0.5 mL, Rfl: 0   rizatriptan (MAXALT) 10 MG tablet, TAKE 1 TABLET BY MOUTH AS NEEDED FOR MIGRAINE. MAY REPEAT IN 2 HOURS IF NEEDED, Disp: 12 tablet, Rfl: 5   topiramate (TOPAMAX) 50 MG tablet, Take 1 tablet (50 mg total) by mouth 2 (two) times daily., Disp: 180 tablet, Rfl: 3  EXAM: BP Readings from Last 3 Encounters:  09/21/21 119/83  05/31/21 113/75  01/04/21 110/62    VITALS per patient if applicable:  GENERAL: alert, oriented, appears  well and in no acute distress  HEENT: atraumatic, conjunttiva clear, no obvious abnormalities on inspection of external nose and ears  NECK: normal movements of the head and neck  LUNGS: on inspection no signs of respiratory distress, breathing rate appears normal, no obvious gross SOB, gasping or wheezing  CV: no obvious cyanosis  MS: moves all visible extremities without noticeable abnormality  PSYCH/NEURO: pleasant and cooperative, no obvious depression or anxiety, speech and thought processing grossly intact Lab Results  Component Value Date   WBC 6.4 08/25/2021   HGB 11.3 (L)  08/25/2021   HCT 34.9 (L) 08/25/2021   PLT 160.0 08/25/2021   GLUCOSE 95 01/06/2021   CHOL 214 (H) 01/06/2021   TRIG 123.0 01/06/2021   HDL 62.30 01/06/2021   LDLCALC 127 (H) 01/06/2021   ALT 14 01/06/2021   AST 18 01/06/2021   NA 140 01/06/2021   K 4.0 01/06/2021   CL 106 01/06/2021   CREATININE 0.88 01/06/2021   BUN 26 (H) 01/06/2021   CO2 27 01/06/2021   TSH 2.89 01/06/2021   INR 1.0 05/29/2019  Last colon 2016 dr buccini healed  pin point ulcration and beneign polyp.  ASSESSMENT AND PLAN:  Discussed the following assessment and plan:    ICD-10-CM   1. Iron deficiency anemia, unspecified iron deficiency anemia type  D50.9 CBC with Differential/Platelet    IBC Panel([facility])    Ambulatory referral to Gastroenterology    2. Cardiovascular risk factor  Z91.89 CT CARDIAC SCORING (SELF PAY ONLY)    3. Hyperlipidemia, unspecified hyperlipidemia type  E78.5     Noton asa nsaids but is on fluoxetine  Okay to take iron every other day as tolerated Refer back to GI [facility] to discuss when and if need for follow-up colonoscopy or monitoring Orders placed for CBC iron studies to be done in 3 to 4 months if not done elsewhere. Will order ct cardiac scoring  as discussed  Counseled.   Expectant management and discussion of plan and treatment with opportunity to ask questions and all were answered. The patient agreed with the plan and demonstrated an understanding of the instructions.   Advised to call back or seek an in-person evaluation if worsening  or having  further concerns  in interim. Return for depending on results lab and results .   Berniece Andreas, MD

## 2021-09-22 ENCOUNTER — Encounter: Payer: Self-pay | Admitting: Internal Medicine

## 2021-10-23 ENCOUNTER — Ambulatory Visit (INDEPENDENT_AMBULATORY_CARE_PROVIDER_SITE_OTHER)
Admission: RE | Admit: 2021-10-23 | Discharge: 2021-10-23 | Disposition: A | Discharge status: Home / Self Care | Payer: Self-pay | Source: Ambulatory Visit | Attending: Internal Medicine | Admitting: Internal Medicine

## 2021-10-23 ENCOUNTER — Other Ambulatory Visit: Payer: Self-pay

## 2021-10-23 DIAGNOSIS — Z9189 Other specified personal risk factors, not elsewhere classified: Secondary | ICD-10-CM

## 2021-10-24 NOTE — Progress Notes (Signed)
So coronary calcium is modest and can consider medication However other abnormality seen on CT were some lung changes like bronchiectasis and possibly chronic atypical infection. Also enlargement of the aorta.  That should require further imaging.  And follow-up to make sure not enlarging these findings will require follow-.   I would like to perform a referral to pulmonary and then discuss further imaging.  As well as statin medicine option  I advise we make a virtual visit to discuss all of this and make a plan for follow-up.

## 2021-10-25 ENCOUNTER — Other Ambulatory Visit: Payer: Self-pay

## 2021-10-25 DIAGNOSIS — R9389 Abnormal findings on diagnostic imaging of other specified body structures: Secondary | ICD-10-CM

## 2021-10-25 DIAGNOSIS — R935 Abnormal findings on diagnostic imaging of other abdominal regions, including retroperitoneum: Secondary | ICD-10-CM

## 2021-10-26 ENCOUNTER — Encounter: Payer: Self-pay | Admitting: Internal Medicine

## 2021-10-26 ENCOUNTER — Telehealth (INDEPENDENT_AMBULATORY_CARE_PROVIDER_SITE_OTHER): Payer: Medicare Other | Admitting: Internal Medicine

## 2021-10-26 DIAGNOSIS — Z79899 Other long term (current) drug therapy: Secondary | ICD-10-CM

## 2021-10-26 DIAGNOSIS — R9389 Abnormal findings on diagnostic imaging of other specified body structures: Secondary | ICD-10-CM | POA: Diagnosis not present

## 2021-10-26 DIAGNOSIS — R931 Abnormal findings on diagnostic imaging of heart and coronary circulation: Secondary | ICD-10-CM | POA: Insufficient documentation

## 2021-10-26 DIAGNOSIS — I7121 Aneurysm of the ascending aorta, without rupture: Secondary | ICD-10-CM | POA: Diagnosis not present

## 2021-10-26 DIAGNOSIS — I77810 Thoracic aortic ectasia: Secondary | ICD-10-CM | POA: Diagnosis not present

## 2021-10-26 DIAGNOSIS — D696 Thrombocytopenia, unspecified: Secondary | ICD-10-CM

## 2021-10-26 DIAGNOSIS — E611 Iron deficiency: Secondary | ICD-10-CM

## 2021-10-26 DIAGNOSIS — D649 Anemia, unspecified: Secondary | ICD-10-CM

## 2021-10-26 DIAGNOSIS — E785 Hyperlipidemia, unspecified: Secondary | ICD-10-CM

## 2021-10-26 DIAGNOSIS — I700 Atherosclerosis of aorta: Secondary | ICD-10-CM

## 2021-10-26 MED ORDER — ROSUVASTATIN CALCIUM 5 MG PO TABS: 5.0000 mg | ORAL_TABLET | Freq: Every day | ORAL | 1 refills | Status: DC

## 2021-10-26 NOTE — Progress Notes (Signed)
Virtual Visit via Video Note  I connected with Alicia Keith on 10/26/21 at 11:00 AM EST by a video enabled telemedicine application and verified that I am speaking with the correct person using two identifiers. Location patient: home Location provider:home office Persons participating in the virtual visit: patient, provider also spouse  [place] national recommendations  regarding COVID 19 pandemic   video visit is advised over in office visit for this patient.  Patient aware  of the limitations of evaluation and management by telemedicine and  availability of in person appointments. and agreed to proceed.   HPI: Alicia Keith presents for video visit Multiple incidental findings   that have, from imaging evaluations. Coronary CT score score 41st percentile.  No family history of premature vascular disease mom cholesterol in the 300s on statin medicine living into her 28s. Abnormal lung findings incidental.  See CT scan report below has no pulmonary symptoms does have some throat clearing chronic postnasal drainage using Zyrtec but generally well. Has seen GI  and to have colon  April   and advised next lab to have folic acid and 123456 level.  Is not on an iron supplement that has 123456 and folic acid in it. Scans also showed a mildly dilated ascending aorta 39 mm advised advanced imaging.  She has no symptoms negative family history father had rheumatic heart disease but no specific valvular disease. Again she has no cardiovascular symptoms.    ROS: See pertinent positives and negatives per HPI.  Past Medical History:  Diagnosis Date   Allergic rhinitis, cause unspecified    Headaches, cluster    History of chicken pox    Migraine headache     Past Surgical History:  Procedure Laterality Date   TONSILLECTOMY  1955    Family History  Problem Relation Age of Onset   Hyperlipidemia Mother    Renal cancer Mother    Hypertension Father    Stroke Father     Social  History   Tobacco Use   Smoking status: Never   Smokeless tobacco: Never   Tobacco comments:    married, retired PT  Substance Use Topics   Alcohol use: Yes    Comment: "Couple glasses of wine"   Drug use: No      Current Outpatient Medications:    baclofen (LIORESAL) 10 MG tablet, TAKE 1 TABLET BY MOUTH DAILY AS NEEDED FOR MUSCLE SPASMS., Disp: 90 each, Rfl: 1   COVID-19 mRNA bivalent vaccine, Pfizer, (PFIZER COVID-19 VAC BIVALENT) injection, Inject into the muscle., Disp: 0.3 mL, Rfl: 0   Estradiol 10 MCG TABS vaginal tablet, Place vaginally. Using twice weekly, Disp: , Rfl:    FLUoxetine (PROZAC) 10 MG capsule, Take 1 capsule (10 mg total) by mouth daily., Disp: 30 capsule, Rfl: 3   ibandronate (BONIVA) 150 MG tablet, Take 150 mg by mouth every 30 (thirty) days. Take in the morning with a full glass of water, on an empty stomach, and do not take anything else by mouth or lie down for the next 30 min., Disp: , Rfl:    influenza vaccine adjuvanted (FLUAD QUADRIVALENT) 0.5 ML injection, Inject into the muscle., Disp: 0.5 mL, Rfl: 0   rizatriptan (MAXALT) 10 MG tablet, TAKE 1 TABLET BY MOUTH AS NEEDED FOR MIGRAINE. MAY REPEAT IN 2 HOURS IF NEEDED, Disp: 12 tablet, Rfl: 5   rosuvastatin (CRESTOR) 5 MG tablet, Take 1 tablet (5 mg total) by mouth daily., Disp: 90 tablet, Rfl: 1   topiramate (  TOPAMAX) 50 MG tablet, Take 1 tablet (50 mg total) by mouth 2 (two) times daily., Disp: 180 tablet, Rfl: 3  EXAM: BP Readings from Last 3 Encounters:  09/21/21 119/83  05/31/21 113/75  01/04/21 110/62    VITALS per patient if applicable:  GENERAL: alert, oriented, appears well and in no acute distress looks well  no cough ocass throat clearing  HEENT: atraumatic, conjunttiva clear, no obvious abnormalities on inspection of external nose and ears  NECK: normal movements of the head and neck  LUNGS: on inspection no signs of respiratory distress, breathing rate appears normal, no obvious gross  SOB, gasping or wheezing  CV: no obvious cyanosis  PSYCH/NEURO: pleasant and cooperative, no obvious depression or anxiety, speech and thought processing grossly intact Lab Results  Component Value Date   WBC 6.4 08/25/2021   HGB 11.3 (L) 08/25/2021   HCT 34.9 (L) 08/25/2021   PLT 160.0 08/25/2021   GLUCOSE 95 01/06/2021   CHOL 214 (H) 01/06/2021   TRIG 123.0 01/06/2021   HDL 62.30 01/06/2021   LDLCALC 127 (H) 01/06/2021   ALT 14 01/06/2021   AST 18 01/06/2021   NA 140 01/06/2021   K 4.0 01/06/2021   CL 106 01/06/2021   CREATININE 0.88 01/06/2021   BUN 26 (H) 01/06/2021   CO2 27 01/06/2021   TSH 2.89 01/06/2021   INR 1.0 05/29/2019  IMPRESSION: 1. Widespread patchy tree-in-bud opacities and nodularity throughout the visualized lungs bilaterally, most prominent in the right middle lobe and lingula, with associated mild cylindrical bronchiectasis. Subpleural triangular foci of consolidation in the peripheral right middle lobe and lingula at the areas of bronchiectasis. 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 Suggest pulmonology consultation and further evaluation with high-resolution chest CT. 2. Dilated 4.1 cm ascending thoracic aorta. Recommend annual imaging followup by CTA or MRA. This recommendation follows 2010 ACCF/AHA/AATS/ACR/ASA/SCA/SCAI/SIR/STS/SVM Guidelines for the Diagnosis and Management of Patients with Thoracic Aortic Disease. Circulation. 2010; [id number]. Aortic aneurysm NOS (ICD10-I71.9). 3. Aortic Atherosclerosis (ICD10-I70.0).     Electronically Signed   By: Ilona Sorrel M.D.   On: 10/23/2021 18:22   IMPRESSION: Coronary calcium score of 10.4. This was 42nd percentile for age-, race-, and sex-matched controls.   Mildly dilated ascending aorta at 21mm. Recommend dedicated gate Chest CTA for further evaluation. The 10-year ASCVD risk score (Arnett DK, et al., 2019) is:  7.6%   Values used to calculate the score:     Age: 71 years     Sex: Female     Is Non-Hispanic African American: No     Diabetic: No     Tobacco smoker: No     Systolic Blood Pressure: 99991111 mmHg     Is BP treated: No     HDL Cholesterol: 62.3 mg/dL     Total Cholesterol: 214 mg/dL  ASSESSMENT AND PLAN:  Discussed the following assessment and plan:    ICD-10-CM   1. Agatston coronary artery calcium score less than 100  R93.1    41 percential 10 year calc risk 7.5 %  mom with  high lipids on statin  in 90s,will try low dose statin    2. Ascending aorta dilatation (HCC)  I77.810 ECHOCARDIOGRAM COMPLETE   39 mm  incidental finding     3. Aneurysm of ascending aorta without rupture   dilitation  I71.21 ECHOCARDIOGRAM COMPLETE    4. Abnormal chest lung fields  CT  R93.89    incidental finding  5. Anemia, mild  D64.9 CBC with Differential/Platelet    IBC panel    Vitamin B12    Folate    6. Iron deficiency  E61.1 CBC with Differential/Platelet    IBC panel    Vitamin B12    Folate    7. Atherosclerosis of aorta (HCC)  I70.0     8. Thrombocytopenia (HCC)  D69.6 CBC with Differential/Platelet    IBC panel    Vitamin B12    Folate    9. Hyperlipidemia, unspecified hyperlipidemia type  E78.5 Lipid panel    10. Medication management  Z79.899 Lipid panel     Trial of statin low dose  and fu lipids  in about 3 mos  Future  labs add b12 and folate to cbc diff and iron. Getting April colonoscopy.  Referral to pulmonary ( already placed) Echo cardiogram to check av aortic root  and then fu vascular to decide on fu and if further imaging advised.   Counseled.   Expectant management and discussion of plan and treatment with opportunity to ask questions and all were answered. The patient agreed with the plan and demonstrated an understanding of the instructions.   Advised to call back or seek an in-person evaluation if worsening  or having  further concerns  in  interim. Return for depending on results.    Shanon Ace, MD

## 2021-11-02 ENCOUNTER — Other Ambulatory Visit: Payer: Self-pay

## 2021-11-02 ENCOUNTER — Ambulatory Visit (HOSPITAL_COMMUNITY): Payer: Medicare Other | Attending: Internal Medicine

## 2021-11-02 DIAGNOSIS — I77810 Thoracic aortic ectasia: Secondary | ICD-10-CM

## 2021-11-02 DIAGNOSIS — I340 Nonrheumatic mitral (valve) insufficiency: Secondary | ICD-10-CM

## 2021-11-02 DIAGNOSIS — I7121 Aneurysm of the ascending aorta, without rupture: Secondary | ICD-10-CM

## 2021-11-02 LAB — ECHOCARDIOGRAM COMPLETE: Area-P 1/2: 3.85 cm2

## 2021-11-09 ENCOUNTER — Encounter: Payer: Self-pay | Admitting: Pulmonary Disease

## 2021-11-09 ENCOUNTER — Ambulatory Visit (INDEPENDENT_AMBULATORY_CARE_PROVIDER_SITE_OTHER): Payer: Medicare Other | Admitting: Pulmonary Disease

## 2021-11-09 ENCOUNTER — Other Ambulatory Visit: Payer: Self-pay

## 2021-11-09 VITALS — BP 116/78 | HR 80 | Ht 60.0 in | Wt 118.6 lb

## 2021-11-09 DIAGNOSIS — J479 Bronchiectasis, uncomplicated: Secondary | ICD-10-CM | POA: Diagnosis not present

## 2021-11-09 NOTE — Patient Instructions (Signed)
You have mild bronchiectasis.  If you develop progressive cough, sputum production, wheezing or shortness of breath please call us for an earlier appointment.  Follow up in 1 year with repeat CT scan then

## 2021-11-09 NOTE — Progress Notes (Signed)
Synopsis: Referred in February 2023 for abnormal CT scan by Berniece Andreas, MD  Subjective:   PATIENT ID: Alicia Jenny GENDER: female DOB: [birth year], MRN: [id number]  HPI  Chief Complaint  Patient presents with   Consult    Referred by PCP for abnormal CT that showed bronchiectasis. Denies any current concerns.    Alicia Keith is a 71 year old woman, never smoker who is referred to pulmonary clinic for abnormal CT scan.   She had CT Cardiac scan on 10/23/21 which showed wide spread patchy tree-in-bud opacities and nodularity throughout the visualized lungs bilaterally, most prominent in the right middle lobe and lingula with associated mild cylindrical bronchiectasis. Subpleural triangular foci of consolidation in the peripheral right middle lobe and lingula at the areas of bronchiectasis.  She is a never smoker. She denies GERD. She reports 1 episode of pneumonia in the past but no history of recurrent infections. She has seasonal allergies with sinus congestion which she manages with OTV zyrtec. She denies cough, wheezing or sputum production. She denies shortness of breath or limitations to her day to day activities. She denies dysphagia. She has no fevers, chills, sweats or weight loss.   She is a retired Adult nurse.  Past Medical History:  Diagnosis Date   Allergic rhinitis, cause unspecified    Headaches, cluster    History of chicken pox    Migraine headache      Family History  Problem Relation Age of Onset   Hyperlipidemia Mother    Renal cancer Mother    Hypertension Father    Stroke Father      Social History   Socioeconomic History   Marital status: Married    Spouse name: Not on file   Number of children: 1   Years of education: 17   Highest education level: Not on file  Occupational History   Occupation: Retired    Comment: PT  Tobacco Use   Smoking status: Never   Smokeless tobacco: Never   Tobacco comments:    married, retired PT   Substance and Sexual Activity   Alcohol use: Yes    Comment: "Couple glasses of wine"   Drug use: No   Sexual activity: Not on file  Other Topics Concern   Not on file  Social History Narrative   8 hours of sleep per night   Lives with Dr. 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    Pet cat   Social Determinants of Health   Financial Resource Strain: Low Risk    Difficulty of Paying Living Expenses: Not hard at all  Food Insecurity: No Food Insecurity   Worried About Programme researcher, broadcasting/film/video in the Last Year: Never true   Barista in the Last Year: Never true  Transportation Needs: No Transportation Needs   Lack of Transportation (Medical): No   Lack of Transportation (Non-Medical): No  Physical Activity: Sufficiently Active   Days of Exercise per Week: 7 days   Minutes of Exercise per Session: 30 min  Stress: No Stress Concern Present   Feeling of Stress : Not at all  Social Connections: Moderately Isolated   Frequency of Communication with Friends and Family: More than three times a week   Frequency of Social Gatherings with Friends and Family: More than three times a week   Attends Religious Services: Never   Database administrator or Organizations: No   Attends Banker Meetings: Never  Marital Status: Married  Catering manager Violence: Not At Risk   Fear of Current or Ex-Partner: No   Emotionally Abused: No   Physically Abused: No   Sexually Abused: No     Allergies  Allergen Reactions   Amitriptyline     palpitations   Nortriptyline     Palpitations and racing heart beat   Imipramine Rash     Outpatient Medications Prior to Visit  Medication Sig Dispense Refill   baclofen (LIORESAL) 10 MG tablet TAKE 1 TABLET BY MOUTH DAILY AS NEEDED FOR MUSCLE SPASMS. 90 each 1   COVID-19 mRNA bivalent vaccine, Pfizer, (PFIZER COVID-19 VAC BIVALENT) injection Inject into the muscle. 0.3 mL 0   Estradiol 10 MCG TABS vaginal tablet Place vaginally.  Using twice weekly     FLUoxetine (PROZAC) 10 MG capsule Take 1 capsule (10 mg total) by mouth daily. 30 capsule 3   ibandronate (BONIVA) 150 MG tablet Take 150 mg by mouth every 30 (thirty) days. Take in the morning with a full glass of water, on an empty stomach, and do not take anything else by mouth or lie down for the next 30 min.     influenza vaccine adjuvanted (FLUAD QUADRIVALENT) 0.5 ML injection Inject into the muscle. 0.5 mL 0   rizatriptan (MAXALT) 10 MG tablet TAKE 1 TABLET BY MOUTH AS NEEDED FOR MIGRAINE. MAY REPEAT IN 2 HOURS IF NEEDED 12 tablet 5   rosuvastatin (CRESTOR) 5 MG tablet Take 1 tablet (5 mg total) by mouth daily. 90 tablet 1   topiramate (TOPAMAX) 50 MG tablet Take 1 tablet (50 mg total) by mouth 2 (two) times daily. 180 tablet 3   No facility-administered medications prior to visit.    Review of Systems  Constitutional:  Negative for chills, fever, malaise/fatigue and weight loss.  HENT:  Negative for congestion, sinus pain and sore throat.   Eyes: Negative.   Respiratory:  Negative for cough, hemoptysis, sputum production, shortness of breath and wheezing.   Cardiovascular:  Negative for chest pain, palpitations, orthopnea, claudication and leg swelling.  Gastrointestinal:  Negative for abdominal pain, heartburn, nausea and vomiting.  Genitourinary: Negative.   Musculoskeletal:  Negative for joint pain and myalgias.  Skin:  Negative for rash.  Neurological:  Negative for weakness.  Endo/Heme/Allergies: Negative.   Psychiatric/Behavioral: Negative.       Objective:   Vitals:   11/09/21 1330  BP: 116/78  Pulse: 80  SpO2: 99%  Weight: 118 lb 9.6 oz (53.8 kg)  Height: 5' (1.524 m)     Physical Exam Constitutional:      General: She is not in acute distress.    Appearance: She is not ill-appearing.  HENT:     Head: Normocephalic and atraumatic.  Eyes:     General: No scleral icterus.    Conjunctiva/sclera: Conjunctivae normal.     Pupils:  Pupils are equal, round, and reactive to light.  Cardiovascular:     Rate and Rhythm: Normal rate and regular rhythm.     Pulses: Normal pulses.     Heart sounds: Normal heart sounds. No murmur heard. Pulmonary:     Effort: Pulmonary effort is normal.     Breath sounds: Normal breath sounds. No wheezing, rhonchi or rales.  Abdominal:     General: Bowel sounds are normal.     Palpations: Abdomen is soft.  Musculoskeletal:     Right lower leg: No edema.     Left lower leg: No edema.  Lymphadenopathy:  Cervical: No cervical adenopathy.  Skin:    General: Skin is warm and dry.  Neurological:     General: No focal deficit present.     Mental Status: She is alert.  Psychiatric:        Mood and Affect: Mood normal.        Behavior: Behavior normal.        Thought Content: Thought content normal.        Judgment: Judgment normal.      CBC    Component Value Date/Time   WBC 6.4 08/25/2021 1108   RBC 4.24 08/25/2021 1108   HGB 11.3 (L) 08/25/2021 1108   HCT 34.9 (L) 08/25/2021 1108   PLT 160.0 08/25/2021 1108   MCV 82.4 08/25/2021 1108   MCHC 32.2 08/25/2021 1108   RDW 14.7 08/25/2021 1108   LYMPHSABS 1.6 08/25/2021 1108   MONOABS 0.6 08/25/2021 1108   EOSABS 0.2 08/25/2021 1108   BASOSABS 0.2 (H) 08/25/2021 1108   BMP Latest Ref Rng & Units 01/06/2021 05/29/2019 03/26/2018  Glucose 70 - 99 mg/dL 95 83 90  BUN 6 - 23 mg/dL 80(D) 21 98(P)  Creatinine 0.40 - 1.20 mg/dL 3.82 5.05 3.97  BUN/Creat Ratio 12 - 28 - - -  Sodium 135 - 145 mEq/L 140 141 138  Potassium 3.5 - 5.1 mEq/L 4.0 4.1 4.3  Chloride 96 - 112 mEq/L 106 105 102  CO2 19 - 32 mEq/L 27 31 27   Calcium 8.4 - 10.5 mg/dL 9.0 8.9 9.3   Chest imaging: CT Cardiac Scan 10/23/21 1. Widespread patchy tree-in-bud opacities and nodularity throughout the visualized lungs bilaterally, most prominent in the right middle lobe and lingula, with associated mild cylindrical bronchiectasis. Subpleural triangular foci of  consolidation in the peripheral right middle lobe and lingula at the areas of bronchiectasis. 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 Suggest pulmonology consultation and further evaluation with high-resolution chest CT. 2. Dilated 4.1 cm ascending thoracic aorta. Recommend annual imaging followup by CTA or MRA. This recommendation follows 2010 ACCF/AHA/AATS/ACR/ASA/SCA/SCAI/SIR/STS/SVM Guidelines for the Diagnosis and Management of Patients with Thoracic Aortic Disease. Circulation. 2010; 1212011. Aortic aneurysm NOS (ICD10-I71.9). 3. Aortic Atherosclerosis  PFT: No flowsheet data found.  Labs:  Path:  Echo 10/2021: LV EF 55-60%. Grade I diastolic dysfunction. RV systolic function and size are normal. Mitral valve regurgitation present.   Heart Catheterization:  Assessment & Plan:   Bronchiectasis without complication (HCC) - Plan: CT CHEST HIGH RESOLUTION  Discussion: Alicia Keith is a 71 year old woman, never smoker who is referred to pulmonary clinic for abnormal CT scan.   She has mild cylindrical bronchiectasis of the anterior right middle lobe and lingula on recent CT cardiac scan.   She is asymptomatic from a bronchiectasis standpoint and no signs of infection concerning for severe mycobacterium avium infection.   We discussed monitoring for cough, sputum production, shortness of breath, fevers, chills, sweats or weight loss to call our office.   Follow up in 1 year with high resolution CT Chest scan.   59, MD [facility] Office: [phone]   Current Outpatient Medications:    baclofen (LIORESAL) 10 MG tablet, TAKE 1 TABLET BY MOUTH DAILY AS NEEDED FOR MUSCLE SPASMS., Disp: 90 each, Rfl: 1   COVID-19 mRNA bivalent vaccine, Pfizer, (PFIZER COVID-19 VAC BIVALENT) injection, Inject into the muscle., Disp: 0.3 mL, Rfl: 0   Estradiol 10 MCG TABS  vaginal tablet, Place  vaginally. Using twice weekly, Disp: , Rfl:    FLUoxetine (PROZAC) 10 MG capsule, Take 1 capsule (10 mg total) by mouth daily., Disp: 30 capsule, Rfl: 3   ibandronate (BONIVA) 150 MG tablet, Take 150 mg by mouth every 30 (thirty) days. Take in the morning with a full glass of water, on an empty stomach, and do not take anything else by mouth or lie down for the next 30 min., Disp: , Rfl:    influenza vaccine adjuvanted (FLUAD QUADRIVALENT) 0.5 ML injection, Inject into the muscle., Disp: 0.5 mL, Rfl: 0   rizatriptan (MAXALT) 10 MG tablet, TAKE 1 TABLET BY MOUTH AS NEEDED FOR MIGRAINE. MAY REPEAT IN 2 HOURS IF NEEDED, Disp: 12 tablet, Rfl: 5   rosuvastatin (CRESTOR) 5 MG tablet, Take 1 tablet (5 mg total) by mouth daily., Disp: 90 tablet, Rfl: 1   topiramate (TOPAMAX) 50 MG tablet, Take 1 tablet (50 mg total) by mouth 2 (two) times daily., Disp: 180 tablet, Rfl: 3

## 2021-11-29 ENCOUNTER — Ambulatory Visit (INDEPENDENT_AMBULATORY_CARE_PROVIDER_SITE_OTHER): Payer: Medicare Other | Admitting: Adult Health

## 2021-11-29 ENCOUNTER — Encounter: Payer: Self-pay | Admitting: Adult Health

## 2021-11-29 VITALS — BP 111/73 | HR 78 | Ht 60.0 in | Wt 119.6 lb

## 2021-11-29 DIAGNOSIS — G43011 Migraine without aura, intractable, with status migrainosus: Secondary | ICD-10-CM

## 2021-11-29 DIAGNOSIS — F419 Anxiety disorder, unspecified: Secondary | ICD-10-CM | POA: Diagnosis not present

## 2021-11-29 MED ORDER — TOPIRAMATE 50 MG PO TABS: 50.0000 mg | ORAL_TABLET | Freq: Every day | ORAL | 3 refills | Status: DC

## 2021-11-29 MED ORDER — FLUOXETINE HCL 20 MG PO CAPS: 20.0000 mg | ORAL_CAPSULE | Freq: Every day | ORAL | 3 refills | Status: DC

## 2021-11-29 NOTE — Progress Notes (Signed)
? ? ?PATIENT: Alicia Keith ?DOB: [birth year] ? ?REASON FOR VISIT: follow up ?HISTORY FROM: patient ? ?HISTORY OF PRESENT ILLNESS: ?Today 11/29/21: ? ?Alicia Keith is a 71 year old female with a history of migraine headaches and anxiety.  She returns today for follow-up.  She is currently on Topamax.  Reports that she was able to decrease her dose to 50 mg daily.  She has approximately 4 headaches a week.  In 2-3 severe headaches a month.  She states for her severe headache she will use rizatriptan with good benefit.  She reports that reducing her dose of Topamax did not increase her headache frequency.  She is on Prozac for her anxiety.  She would like it increased to 20 mg.  States recently she has had more anxiety that has not been controlled. ? ?05/31/21: Alicia Keith is a 71 year old female with a history of migraine headaches and anxiety.  She returns today for follow-up.  She reports that amitriptyline worked well for her.  She states that she did not have any headaches that she felt the best that she is felt in a long time however after 3 to 4 months she began to have what she describes as heart palpitation.  She did stop the medication and the symptoms resolved.  However her headaches have returned.  She states that she has almost a mild headache on a daily basis.  She typically will use Advil or baclofen.  For more severe headache she will use the Maxalt.  States that she gets 2-3 intense headaches a month.  Continues on Topamax 50 mg twice a day ? ?11/29/20: Alicia Keith is a 71 year old female with a history of migraine headaches and anxiety.  She returns today for follow-up.  She reports that she continues to have daily headaches.  But she only has 2-3 migraines a month.  She continues on Topamax 50 mg twice a day.  She reports that with severe headache she does take rizatriptan with good benefit.  The patient has tried multiple medications in the past for her migraines including CGRP agents,  Botox and several oral medication.  The patient is currently happy with her migraine management.  At the last visit with Dr. Brett Fairy she was switched from Zoloft to Prozac.  Reports that since she started Prozac she has been getting grinding her teeth at night.  She would like to switch to another medication.  She mentions that she would like to try Celexa.  She also mentions amitriptyline or nortriptyline although she has tried this before in the past. ? ?HISTORY 11-04-2019 (Copied from Dr.Dohmeier's note) ? ?Have the pleasure of meeting today with Alicia Keith minimal 71 year old Caucasian patient with a history of chronic migraines without aura.  She did have status migrainosus many many times in her life but her current level of migraine frequency and intensity has been better controlled.  She has responded well to Ajovy but then the effect wore off.  In my last visit she started Zoloft but she has developed a "aura and there is a suspicion that this may be related to the SSRI sertraline.  We are discussing today to change her to Prozac which should be used at 20 mg daily does not require any kind of washout. ?  ?By now she has failed Botox, all triptans, Ajovy, and only Roselyn Meier has not been tries.  ? ?  ? ?REVIEW OF SYSTEMS: Out of a complete 14 system review of symptoms, the patient complains only of  the following symptoms, and all other reviewed systems are negative. ? ?ALLERGIES: ?Allergies  ?Allergen Reactions  ? Amitriptyline   ?  palpitations  ? Nortriptyline   ?  Palpitations and racing heart beat  ? Imipramine Rash  ? ? ?HOME MEDICATIONS: ?Outpatient Medications Prior to Visit  ?Medication Sig Dispense Refill  ? baclofen (LIORESAL) 10 MG tablet TAKE 1 TABLET BY MOUTH DAILY AS NEEDED FOR MUSCLE SPASMS. 90 each 1  ? COVID-19 mRNA bivalent vaccine, Pfizer, (PFIZER COVID-19 VAC BIVALENT) injection Inject into the muscle. 0.3 mL 0  ? Estradiol 10 MCG TABS vaginal tablet Place vaginally. Using twice  weekly    ? FLUoxetine (PROZAC) 10 MG capsule Take 1 capsule (10 mg total) by mouth daily. 30 capsule 3  ? ibandronate (BONIVA) 150 MG tablet Take 150 mg by mouth every 30 (thirty) days. Take in the morning with a full glass of water, on an empty stomach, and do not take anything else by mouth or lie down for the next 30 min.    ? influenza vaccine adjuvanted (FLUAD QUADRIVALENT) 0.5 ML injection Inject into the muscle. 0.5 mL 0  ? rizatriptan (MAXALT) 10 MG tablet TAKE 1 TABLET BY MOUTH AS NEEDED FOR MIGRAINE. MAY REPEAT IN 2 HOURS IF NEEDED 12 tablet 5  ? rosuvastatin (CRESTOR) 5 MG tablet Take 1 tablet (5 mg total) by mouth daily. 90 tablet 1  ? topiramate (TOPAMAX) 50 MG tablet Take 1 tablet (50 mg total) by mouth 2 (two) times daily. 180 tablet 3  ? ?No facility-administered medications prior to visit.  ? ? ?PAST MEDICAL HISTORY: ?Past Medical History:  ?Diagnosis Date  ? Allergic rhinitis, cause unspecified   ? Headaches, cluster   ? History of chicken pox   ? Migraine headache   ? ? ?PAST SURGICAL HISTORY: ?Past Surgical History:  ?Procedure Laterality Date  ? TONSILLECTOMY  1955  ? ? ?FAMILY HISTORY: ?Family History  ?Problem Relation Age of Onset  ? Hyperlipidemia Mother   ? Renal cancer Mother   ? Migraines Mother   ? Hypertension Father   ? Stroke Father   ? Migraines Maternal Aunt   ? Migraines Maternal Grandmother   ? ? ?SOCIAL HISTORY: ?Social History  ? ?Socioeconomic History  ? Marital status: Married  ?  Spouse name: Not on file  ? Number of children: 1  ? Years of education: 60  ? Highest education level: Not on file  ?Occupational History  ? Occupation: Retired  ?  Comment: PT  ?Tobacco Use  ? Smoking status: Never  ? Smokeless tobacco: Never  ? Tobacco comments:  ?  married, retired PT  ?Substance and Sexual Activity  ? Alcohol use: Yes  ?  Alcohol/week: 3.0 standard drinks  ?  Types: 3 Glasses of wine per week  ?  Comment: "Couple glasses of wine"  ? Drug use: No  ? Sexual activity: Not on file   ?Other Topics Concern  ? Not on file  ?Social History Narrative  ? 8 hours of sleep per night  ? Lives with Dr. Beryle Beams  ? 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 Pet cat  ? ?Social Determinants of Health  ? ?Financial Resource Strain: Low Risk   ? Difficulty of Paying Living Expenses: Not hard at all  ?Food Insecurity: No Food Insecurity  ? Worried About Charity fundraiser in the Last Year: Never true  ? Ran Out of Food in the Last Year: Never true  ?Transportation  Needs: No Transportation Needs  ? Lack of Transportation (Medical): No  ? Lack of Transportation (Non-Medical): No  ?Physical Activity: Sufficiently Active  ? Days of Exercise per Week: 7 days  ? Minutes of Exercise per Session: 30 min  ?Stress: No Stress Concern Present  ? Feeling of Stress : Not at all  ?Social Connections: Moderately Isolated  ? Frequency of Communication with Friends and Family: More than three times a week  ? Frequency of Social Gatherings with Friends and Family: More than three times a week  ? Attends Religious Services: Never  ? Active Member of Clubs or Organizations: No  ? Attends Archivist Meetings: Never  ? Marital Status: Married  ?Intimate Partner Violence: Not At Risk  ? Fear of Current or Ex-Partner: No  ? Emotionally Abused: No  ? Physically Abused: No  ? Sexually Abused: No  ? ? ? ? ?PHYSICAL EXAM ? ?Vitals:  ? 11/29/21 1116  ?BP: 111/73  ?Pulse: 78  ?Weight: 119 lb 9.6 oz (54.3 kg)  ?Height: 5' (1.524 m)  ? ?Body mass index is 23.36 kg/m?. ? ?Generalized: Well developed, in no acute distress  ? ?Neurological examination  ?Mentation: Alert oriented to time, place, history taking. Follows all commands speech and language fluent ?Cranial nerve II-XII: Pupils were equal round reactive to light. Extraocular movements were full, visual field were full on confrontational test. Head turning and shoulder shrug  were normal and symmetric. ?Motor: The motor testing reveals 5 over 5 strength of all 4 extremities.  Good symmetric motor tone is noted throughout.  ?Sensory: Sensory testing is intact to soft touch on all 4 extremities. No evidence of extinction is noted.  ?Coordination: Cerebellar testing reveals goo

## 2021-12-21 LAB — HM COLONOSCOPY

## 2022-01-05 ENCOUNTER — Telehealth (INDEPENDENT_AMBULATORY_CARE_PROVIDER_SITE_OTHER): Payer: Medicare Other

## 2022-01-05 VITALS — BP 107/66 | Ht 60.5 in | Wt 116.5 lb

## 2022-01-05 DIAGNOSIS — Z0000 Encounter for general adult medical examination without abnormal findings: Secondary | ICD-10-CM | POA: Diagnosis not present

## 2022-01-05 NOTE — Progress Notes (Signed)
? ?Subjective:  ? Alicia Keith is a 71 y.o. female who presents for Medicare Annual (Subsequent) preventive examination. ? ?Review of Systems    ?Virtual Visit via Telephone Note ? ?I connected with  Alicia Keith on 01/05/22 at 11:00 AM EDT by telephone and verified that I am speaking with the correct person using two identifiers. ? ?Location: ?Patient: Home ?Provider: Office ?Persons participating in the virtual visit: patient/Nurse Health Advisor ?  ?I discussed the limitations, risks, security and privacy concerns of performing an evaluation and management service by telephone and the availability of in person appointments. The patient expressed understanding and agreed to proceed. ? ?Interactive audio and video telecommunications were attempted between this nurse and patient, however failed, due to patient having technical difficulties OR patient did not have access to video capability.  We continued and completed visit with audio only. ? ?Some vital signs may be absent or patient reported.  ? ?Alicia Rung, LPN  ?Cardiac Risk Factors include: advanced age (>39men, >33 women) ? ?   ?Objective:  ?  ?Today's Vitals  ? 01/05/22 1107  ?BP: 107/66  ?Weight: 116 lb 8 oz (52.8 kg)  ?Height: 5' 0.5" (1.537 m)  ? ?Body mass index is 22.38 kg/m?. ? ? ?  01/05/2022  ? 11:18 AM 01/04/2021  ?  2:13 PM  ?Advanced Directives  ?Does Patient Have a Medical Advance Directive? Yes Yes  ?Type of Estate agent of [place];Living will Healthcare Power of [place];Living will  ?Does patient want to make changes to medical advance directive? No - Patient declined No - Patient declined  ?Copy of Healthcare Power of Attorney in Chart? No - copy requested No - copy requested  ? ? ?Current Medications (verified) ?Outpatient Encounter Medications as of 01/05/2022  ?Medication Sig  ? baclofen (LIORESAL) 10 MG tablet TAKE 1 TABLET BY MOUTH DAILY AS NEEDED FOR MUSCLE SPASMS.  ? COVID-19 mRNA bivalent  vaccine, Pfizer, (PFIZER COVID-19 VAC BIVALENT) injection Inject into the muscle.  ? Estradiol 10 MCG TABS vaginal tablet Place vaginally. Using twice weekly  ? FLUoxetine (PROZAC) 20 MG capsule Take 1 capsule (20 mg total) by mouth daily.  ? ibandronate (BONIVA) 150 MG tablet Take 150 mg by mouth every 30 (thirty) days. Take in the morning with a full glass of water, on an empty stomach, and do not take anything else by mouth or lie down for the next 30 min.  ? influenza vaccine adjuvanted (FLUAD QUADRIVALENT) 0.5 ML injection Inject into the muscle.  ? rizatriptan (MAXALT) 10 MG tablet TAKE 1 TABLET BY MOUTH AS NEEDED FOR MIGRAINE. MAY REPEAT IN 2 HOURS IF NEEDED  ? rosuvastatin (CRESTOR) 5 MG tablet Take 1 tablet (5 mg total) by mouth daily.  ? topiramate (TOPAMAX) 50 MG tablet Take 1 tablet (50 mg total) by mouth daily.  ? ?No facility-administered encounter medications on file as of 01/05/2022.  ? ? ?Allergies (verified) ?Amitriptyline, Nortriptyline, and Imipramine  ? ?History: ?Past Medical History:  ?Diagnosis Date  ? Allergic rhinitis, cause unspecified   ? Headaches, cluster   ? History of chicken pox   ? Migraine headache   ? ?Past Surgical History:  ?Procedure Laterality Date  ? TONSILLECTOMY  1955  ? ?Family History  ?Problem Relation Age of Onset  ? Hyperlipidemia Mother   ? Renal cancer Mother   ? Migraines Mother   ? Hypertension Father   ? Stroke Father   ? Migraines Maternal Aunt   ? Migraines  Maternal Grandmother   ? ?Social History  ? ?Socioeconomic History  ? Marital status: Married  ?  Spouse name: Not on file  ? Number of children: 1  ? Years of education: 50  ? Highest education level: Not on file  ?Occupational History  ? Occupation: Retired  ?  Comment: PT  ?Tobacco Use  ? Smoking status: Never  ? Smokeless tobacco: Never  ? Tobacco comments:  ?  married, retired PT  ?Substance and Sexual Activity  ? Alcohol use: Yes  ?  Alcohol/week: 3.0 standard drinks  ?  Types: 3 Glasses of wine per week   ?  Comment: "Couple glasses of wine"  ? Drug use: No  ? Sexual activity: Not on file  ?Other Topics Concern  ? Not on file  ?Social History Narrative  ? 8 hours of sleep per night  ? Lives with Dr. Cyndie Chime  ? 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 Pet cat  ? ?Social Determinants of Health  ? ?Financial Resource Strain: Low Risk   ? Difficulty of Paying Living Expenses: Not hard at all  ?Food Insecurity: No Food Insecurity  ? Worried About Programme researcher, broadcasting/film/video in the Last Year: Never true  ? Ran Out of Food in the Last Year: Never true  ?Transportation Needs: Not on file  ?Physical Activity: Sufficiently Active  ? Days of Exercise per Week: 7 days  ? Minutes of Exercise per Session: 30 min  ?Stress: No Stress Concern Present  ? Feeling of Stress : Not at all  ?Social Connections: Moderately Integrated  ? Frequency of Communication with Friends and Family: More than three times a week  ? Frequency of Social Gatherings with Friends and Family: More than three times a week  ? Attends Religious Services: More than 4 times per year  ? Active Member of Clubs or Organizations: No  ? Attends Banker Meetings: Never  ? Marital Status: Married  ? ? ? ?Clinical Intake: ? ?Pre-visit preparation completed: No ?Diabetic?  No ? ? ?Activities of Daily Living ? ?  01/05/2022  ? 11:17 AM  ?In your present state of health, do you have any difficulty performing the following activities:  ?Hearing? 0  ?Vision? 0  ?Difficulty concentrating or making decisions? 0  ?Walking or climbing stairs? 0  ?Dressing or bathing? 0  ?Doing errands, shopping? 0  ?Preparing Food and eating ? N  ?Using the Toilet? N  ?In the past six months, have you accidently leaked urine? N  ?Do you have problems with loss of bowel control? N  ?Managing your Medications? N  ?Managing your Finances? N  ? ? ?Patient Care Team: ?Panosh, Neta Mends, MD as PCP - General (Internal Medicine) ?Santiago Glad, MD (Neurology) ?Silverio Lay, MD (Obstetrics and  Gynecology) ? ?Indicate any recent Medical Services you may have received from other than [facility] providers in the past year (date may be approximate). ? ?   ?Assessment:  ? This is a routine wellness examination for [place]. ? ?Hearing/Vision screen ?Hearing Screening - Comments:: No hearing difficulty ?Vision Screening - Comments:: Wears glasses followed by Dr Hyacinth Meeker ? ?Dietary issues and exercise activities discussed: ?Exercise limited by: None identified ? ? Goals Addressed   ? ?  ?  ?  ?  ?  ? This Visit's Progress  ?   Increase physical activity (pt-stated)     ?   Maintain good health ?  ? ?  ? ?Depression Screen ? ?  01/05/2022  ? 11:15 AM 01/04/2021  ?  2:14 PM 03/26/2018  ?  3:22 PM  ?PHQ 2/9 Scores  ?PHQ - 2 Score 0 0 0  ?  ?Fall Risk ? ?  01/05/2022  ? 11:17 AM 01/05/2022  ?  9:34 AM 01/04/2021  ?  2:12 PM 11/04/2019  ?  2:25 PM 06/09/2018  ?  9:23 AM  ?Fall Risk   ?Falls in the past year? 0 0 0 0 No  ?Number falls in past yr: 0  0    ?Injury with Fall? 0  0    ?Risk for fall due to : No Fall Risks      ? ? ?FALL RISK PREVENTION PERTAINING TO THE HOME: ? ?Any stairs in or around the home? Yes  ?If so, are there any without handrails? No  ?Home free of loose throw rugs in walkways, pet beds, electrical cords, etc? Yes  ?Adequate lighting in your home to reduce risk of falls? Yes  ? ?ASSISTIVE DEVICES UTILIZED TO PREVENT FALLS: ? ?Life alert? No  ?Use of a cane, walker or w/c? No  ?Grab bars in the bathroom? No  ?Shower chair or bench in shower? No  ?Elevated toilet seat or a handicapped toilet? No  ? ?TIMED UP AND GO: ? ?Was the test performed? No . Audio Visit ? ?Cognitive Function: ?  ? ?Immunizations ?Immunization History  ?Administered Date(s) Administered  ? Fluad Quad(high Dose 65+) 06/03/2019  ? Influenza, High Dose Seasonal PF 06/06/2016, 05/28/2018  ? Influenza-Unspecified 05/19/2015, 05/18/2016, 06/06/2020, 07/05/2021  ? PFIZER Comirnaty(Gray Top)Covid-19 Tri-Sucrose Vaccine 01/31/2021  ? PFIZER(Purple  Top)SARS-COV-2 Vaccination 10/31/2019, 11/22/2019  ? Research officer, trade unionfizer Covid-19 Vaccine Bivalent Booster 1057yrs & up 06/30/2021  ? Pneumococcal Conjugate-13 04/24/2016  ? Pneumococcal Polysaccharide-23 03/26/2018  ? Td 01/0

## 2022-01-05 NOTE — Patient Instructions (Addendum)
?Ms. Azad , ?Thank you for taking time to come for your Medicare Wellness Visit. I appreciate your ongoing commitment to your health goals. Please review the following plan we discussed and let me know if I can assist you in the future.  ? ?These are the goals we discussed: ? Goals   ? ?   Increase physical activity (pt-stated)   ?   Maintain good health ?  ? ?  ?  ?This is a list of the screening recommended for you and due dates:  ?Health Maintenance  ?Topic Date Due  ? Mammogram  01/06/2023*  ? DEXA scan (bone density measurement)  01/06/2023*  ? Flu Shot  04/17/2022  ? Colon Cancer Screening  01/18/2025  ? Tetanus Vaccine  03/26/2028  ? Pneumonia Vaccine  Completed  ? COVID-19 Vaccine  Completed  ? Hepatitis C Screening: USPSTF Recommendation to screen - Ages 64-79 yo.  Completed  ? Zoster (Shingles) Vaccine  Completed  ? HPV Vaccine  Aged Out  ?*Topic was postponed. The date shown is not the original due date.  ?  ?Advanced directives: Yes Patient will bring Copy ? ?Conditions/risks identified: None ? ?Next appointment: Follow up in one year for your annual wellness visit  ? ? ?Preventive Care 31 Years and Older, Female ?Preventive care refers to lifestyle choices and visits with your health care provider that can promote health and wellness. ?What does preventive care include? ?A yearly physical exam. This is also called an annual well check. ?Dental exams once or twice a year. ?Routine eye exams. Ask your health care provider how often you should have your eyes checked. ?Personal lifestyle choices, including: ?Daily care of your teeth and gums. ?Regular physical activity. ?Eating a healthy diet. ?Avoiding tobacco and drug use. ?Limiting alcohol use. ?Practicing safe sex. ?Taking low-dose aspirin every day. ?Taking vitamin and mineral supplements as recommended by your health care provider. ?What happens during an annual well check? ?The services and screenings done by your health care provider during  your annual well check will depend on your age, overall health, lifestyle risk factors, and family history of disease. ?Counseling  ?Your health care provider may ask you questions about your: ?Alcohol use. ?Tobacco use. ?Drug use. ?Emotional well-being. ?Home and relationship well-being. ?Sexual activity. ?Eating habits. ?History of falls. ?Memory and ability to understand (cognition). ?Work and work Statistician. ?Reproductive health. ?Screening  ?You may have the following tests or measurements: ?Height, weight, and BMI. ?Blood pressure. ?Lipid and cholesterol levels. These may be checked every 5 years, or more frequently if you are over 49 years old. ?Skin check. ?Lung cancer screening. You may have this screening every year starting at age 68 if you have a 30-pack-year history of smoking and currently smoke or have quit within the past 15 years. ?Fecal occult blood test (FOBT) of the stool. You may have this test every year starting at age 74. ?Flexible sigmoidoscopy or colonoscopy. You may have a sigmoidoscopy every 5 years or a colonoscopy every 10 years starting at age 40. ?Hepatitis C blood test. ?Hepatitis B blood test. ?Sexually transmitted disease (STD) testing. ?Diabetes screening. This is done by checking your blood sugar (glucose) after you have not eaten for a while (fasting). You may have this done every 1-3 years. ?Bone density scan. This is done to screen for osteoporosis. You may have this done starting at age 22. ?Mammogram. This may be done every 1-2 years. Talk to your health care provider about how often you should  have regular mammograms. ?Talk with your health care provider about your test results, treatment options, and if necessary, the need for more tests. ?Vaccines  ?Your health care provider may recommend certain vaccines, such as: ?Influenza vaccine. This is recommended every year. ?Tetanus, diphtheria, and acellular pertussis (Tdap, Td) vaccine. You may need a Td booster every 10  years. ?Zoster vaccine. You may need this after age 74. ?Pneumococcal 13-valent conjugate (PCV13) vaccine. One dose is recommended after age 56. ?Pneumococcal polysaccharide (PPSV23) vaccine. One dose is recommended after age 66. ?Talk to your health care provider about which screenings and vaccines you need and how often you need them. ?This information is not intended to replace advice given to you by your health care provider. Make sure you discuss any questions you have with your health care provider. ?Document Released: 09/30/2015 Document Revised: 05/23/2016 Document Reviewed: 07/05/2015 ?Elsevier Interactive Patient Education ? 2017 [place]. ? ?Fall Prevention in the Home ?Falls can cause injuries. They can happen to people of all ages. There are many things you can do to make your home safe and to help prevent falls. ?What can I do on the outside of my home? ?Regularly fix the edges of walkways and driveways and fix any cracks. ?Remove anything that might make you trip as you walk through a door, such as a raised step or threshold. ?Trim any bushes or trees on the path to your home. ?Use bright outdoor lighting. ?Clear any walking paths of anything that might make someone trip, such as rocks or tools. ?Regularly check to see if handrails are loose or broken. Make sure that both sides of any steps have handrails. ?Any raised decks and porches should have guardrails on the edges. ?Have any leaves, snow, or ice cleared regularly. ?Use sand or salt on walking paths during winter. ?Clean up any spills in your garage right away. This includes oil or grease spills. ?What can I do in the bathroom? ?Use night lights. ?Install grab bars by the toilet and in the tub and shower. Do not use towel bars as grab bars. ?Use non-skid mats or decals in the tub or shower. ?If you need to sit down in the shower, use a plastic, non-slip stool. ?Keep the floor dry. Clean up any water that spills on the floor as soon as it  happens. ?Remove soap buildup in the tub or shower regularly. ?Attach bath mats securely with double-sided non-slip rug tape. ?Do not have throw rugs and other things on the floor that can make you trip. ?What can I do in the bedroom? ?Use night lights. ?Make sure that you have a light by your bed that is easy to reach. ?Do not use any sheets or blankets that are too big for your bed. They should not hang down onto the floor. ?Have a firm chair that has side arms. You can use this for support while you get dressed. ?Do not have throw rugs and other things on the floor that can make you trip. ?What can I do in the kitchen? ?Clean up any spills right away. ?Avoid walking on wet floors. ?Keep items that you use a lot in easy-to-reach places. ?If you need to reach something above you, use a strong step stool that has a grab bar. ?Keep electrical cords out of the way. ?Do not use floor polish or wax that makes floors slippery. If you must use wax, use non-skid floor wax. ?Do not have throw rugs and other things on the floor  that can make you trip. ?What can I do with my stairs? ?Do not leave any items on the stairs. ?Make sure that there are handrails on both sides of the stairs and use them. Fix handrails that are broken or loose. Make sure that handrails are as long as the stairways. ?Check any carpeting to make sure that it is firmly attached to the stairs. Fix any carpet that is loose or worn. ?Avoid having throw rugs at the top or bottom of the stairs. If you do have throw rugs, attach them to the floor with carpet tape. ?Make sure that you have a light switch at the top of the stairs and the bottom of the stairs. If you do not have them, ask someone to add them for you. ?What else can I do to help prevent falls? ?Wear shoes that: ?Do not have high heels. ?Have rubber bottoms. ?Are comfortable and fit you well. ?Are closed at the toe. Do not wear sandals. ?If you use a stepladder: ?Make sure that it is fully opened.  Do not climb a closed stepladder. ?Make sure that both sides of the stepladder are locked into place. ?Ask someone to hold it for you, if possible. ?Clearly mark and make sure that you can see: ?Any grab ba

## 2022-01-09 ENCOUNTER — Encounter: Payer: Self-pay | Admitting: Internal Medicine

## 2022-01-10 NOTE — Telephone Encounter (Signed)
?  There are already orders in our system for CBC differential ,IBC panel, B12 ,folate  and lipids ? ? ?You pick the date that works best for y ou and [place]  and we can send the results to them . ? ?You can change appointments    to what works for you and medical team  ?

## 2022-01-10 NOTE — Telephone Encounter (Signed)
Noted  

## 2022-01-17 ENCOUNTER — Ambulatory Visit (INDEPENDENT_AMBULATORY_CARE_PROVIDER_SITE_OTHER): Payer: Medicare Other | Admitting: Internal Medicine

## 2022-01-17 ENCOUNTER — Encounter: Payer: Self-pay | Admitting: Internal Medicine

## 2022-01-17 VITALS — BP 112/80 | HR 81 | Temp 98.5°F | Ht 60.0 in | Wt 119.2 lb

## 2022-01-17 DIAGNOSIS — Z79899 Other long term (current) drug therapy: Secondary | ICD-10-CM

## 2022-01-17 DIAGNOSIS — E785 Hyperlipidemia, unspecified: Secondary | ICD-10-CM

## 2022-01-17 DIAGNOSIS — D696 Thrombocytopenia, unspecified: Secondary | ICD-10-CM | POA: Diagnosis not present

## 2022-01-17 DIAGNOSIS — D649 Anemia, unspecified: Secondary | ICD-10-CM | POA: Diagnosis not present

## 2022-01-17 DIAGNOSIS — I77810 Thoracic aortic ectasia: Secondary | ICD-10-CM

## 2022-01-17 DIAGNOSIS — R931 Abnormal findings on diagnostic imaging of heart and coronary circulation: Secondary | ICD-10-CM

## 2022-01-17 DIAGNOSIS — E611 Iron deficiency: Secondary | ICD-10-CM | POA: Diagnosis not present

## 2022-01-17 DIAGNOSIS — B9681 Helicobacter pylori [H. pylori] as the cause of diseases classified elsewhere: Secondary | ICD-10-CM

## 2022-01-17 DIAGNOSIS — K2970 Gastritis, unspecified, without bleeding: Secondary | ICD-10-CM

## 2022-01-17 LAB — IBC PANEL
Iron: 85 ug/dL (ref 42–145)
Saturation Ratios: 21.8 % (ref 20.0–50.0)
TIBC: 389.2 ug/dL (ref 250.0–450.0)
Transferrin: 278 mg/dL (ref 212.0–360.0)

## 2022-01-17 LAB — CBC WITH DIFFERENTIAL/PLATELET
Basophils Absolute: 0.1 10*3/uL (ref 0.0–0.1)
Basophils Relative: 1.2 % (ref 0.0–3.0)
Eosinophils Absolute: 0.1 10*3/uL (ref 0.0–0.7)
Eosinophils Relative: 1.2 % (ref 0.0–5.0)
HCT: 37.9 % (ref 36.0–46.0)
Hemoglobin: 12.4 g/dL (ref 12.0–15.0)
Lymphocytes Relative: 25.2 % (ref 12.0–46.0)
Lymphs Abs: 1.5 10*3/uL (ref 0.7–4.0)
MCHC: 32.7 g/dL (ref 30.0–36.0)
MCV: 84.1 fl (ref 78.0–100.0)
Monocytes Absolute: 0.5 10*3/uL (ref 0.1–1.0)
Monocytes Relative: 8.3 % (ref 3.0–12.0)
Neutro Abs: 3.9 10*3/uL (ref 1.4–7.7)
Neutrophils Relative %: 64.1 % (ref 43.0–77.0)
Platelets: 160 10*3/uL (ref 150.0–400.0)
RBC: 4.51 Mil/uL (ref 3.87–5.11)
RDW: 15.8 % — ABNORMAL HIGH (ref 11.5–15.5)
WBC: 6.1 10*3/uL (ref 4.0–10.5)

## 2022-01-17 LAB — BASIC METABOLIC PANEL
BUN: 26 mg/dL — ABNORMAL HIGH (ref 6–23)
CO2: 27 mEq/L (ref 19–32)
Calcium: 9.4 mg/dL (ref 8.4–10.5)
Chloride: 101 mEq/L (ref 96–112)
Creatinine, Ser: 0.91 mg/dL (ref 0.40–1.20)
GFR: 62.96 mL/min (ref >60.00)
Glucose, Bld: 99 mg/dL (ref 70–99)
Potassium: 4.1 mEq/L (ref 3.5–5.1)
Sodium: 135 mEq/L (ref 135–145)

## 2022-01-17 LAB — LIPID PANEL
Cholesterol: 168 mg/dL (ref 0–200)
HDL: 81.9 mg/dL (ref >39.00)
LDL Cholesterol: 74 mg/dL (ref 0–99)
NonHDL: 86.23
Total CHOL/HDL Ratio: 2
Triglycerides: 63 mg/dL (ref 0.0–149.0)
VLDL: 12.6 mg/dL (ref 0.0–40.0)

## 2022-01-17 LAB — VITAMIN B12: Vitamin B-12: 1088 pg/mL — ABNORMAL HIGH (ref 211–911)

## 2022-01-17 LAB — FOLATE: Folate: >23.5 ng/mL (ref >5.9)

## 2022-01-17 NOTE — Progress Notes (Signed)
? ?Chief Complaint  ?Patient presents with  ? Annual Exam  ? ? ?HPI: ?Alicia Keith 71 y.o. come in for yearly visit   and fu a number of conditions  evaluations.  ? ?Pulmonary:   Has seen the pulmonary specialist for abnormalities on the CT scan advised yearly ct  abnormal but no sx at present ?CV: borderline inc aortic root 39 mm echo  shows mild mr nl avv good LV function no cardiovascular symptoms exercise intolerance per se ?Had gi eval for mild anemia  iron deficicnecy    h pylori   gastritis was diagnosed by biopsy bx   amox and clarithromycin    treated Iron every other day. ?Episodes of small petechiae has tried on and off different medicines has been off Boniva for a number of months and they seem to go away will probably retry to start again and see what happens.  No active bleeding. ?HLD rosuvastatin 5 mg ?HAs    few a week.  Come and go.   Mom 93 gets HAs.   ?Is on fluoxetine and mood is good has gone out from 10 mg to 20 ?Physical activity treadmill 30 - 40 minutes   garden .  ?TAD    : neg  3-4 glasses per week  ?Sleep: 8- 10    ?Sugar drinks  no.   ?ROS: See pertinent positives and negatives per HPI. ? ?Past Medical History:  ?Diagnosis Date  ? Allergic rhinitis, cause unspecified   ? Headaches, cluster   ? History of chicken pox   ? Migraine headache   ? ? ?Family History  ?Problem Relation Age of Onset  ? Hyperlipidemia Mother   ? Renal cancer Mother   ? Migraines Mother   ? Hypertension Father   ? Stroke Father   ? Migraines Maternal Aunt   ? Migraines Maternal Grandmother   ? ? ?Social History  ? ?Socioeconomic History  ? Marital status: Married  ?  Spouse name: Not on file  ? Number of children: 1  ? Years of education: 8917  ? Highest education level: Not on file  ?Occupational History  ? Occupation: Retired  ?  Comment: PT  ?Tobacco Use  ? Smoking status: Never  ? Smokeless tobacco: Never  ? Tobacco comments:  ?  married, retired PT  ?Substance and Sexual Activity  ? Alcohol use: Yes  ?   Alcohol/week: 3.0 standard drinks  ?  Types: 3 Glasses of wine per week  ?  Comment: "Couple glasses of wine"  ? Drug use: No  ? Sexual activity: Not on file  ?Other Topics Concern  ? Not on file  ?Social History Narrative  ? 8 hours of sleep per night  ? Lives with Dr. Cyndie Chime[place]ortuna  ? 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 Pet cat  ? ?Social Determinants of Health  ? ?Financial Resource Strain: Low Risk   ? Difficulty of Paying Living Expenses: Not hard at all  ?Food Insecurity: No Food Insecurity  ? Worried About Programme researcher, broadcasting/film/videounning Out of Food in the Last Year: Never true  ? Ran Out of Food in the Last Year: Never true  ?Transportation Needs: Not on file  ?Physical Activity: Sufficiently Active  ? Days of Exercise per Week: 7 days  ? Minutes of Exercise per Session: 30 min  ?Stress: No Stress Concern Present  ? Feeling of Stress : Not at all  ?Social Connections: Moderately Integrated  ? Frequency of Communication with Friends and  Family: More than three times a week  ? Frequency of Social Gatherings with Friends and Family: More than three times a week  ? Attends Religious Services: More than 4 times per year  ? Active Member of Clubs or Organizations: No  ? Attends Banker Meetings: Never  ? Marital Status: Married  ? ? ?Outpatient Medications Prior to Visit  ?Medication Sig Dispense Refill  ? baclofen (LIORESAL) 10 MG tablet TAKE 1 TABLET BY MOUTH DAILY AS NEEDED FOR MUSCLE SPASMS. 90 each 1  ? COVID-19 mRNA bivalent vaccine, Pfizer, (PFIZER COVID-19 VAC BIVALENT) injection Inject into the muscle. 0.3 mL 0  ? Estradiol 10 MCG TABS vaginal tablet Place vaginally. Using twice weekly    ? FLUoxetine (PROZAC) 20 MG capsule Take 1 capsule (20 mg total) by mouth daily. 90 capsule 3  ? ibandronate (BONIVA) 150 MG tablet Take 150 mg by mouth every 30 (thirty) days. Take in the morning with a full glass of water, on an empty stomach, and do not take anything else by mouth or lie down for the next 30 min.    ? influenza  vaccine adjuvanted (FLUAD QUADRIVALENT) 0.5 ML injection Inject into the muscle. 0.5 mL 0  ? rizatriptan (MAXALT) 10 MG tablet TAKE 1 TABLET BY MOUTH AS NEEDED FOR MIGRAINE. MAY REPEAT IN 2 HOURS IF NEEDED 12 tablet 5  ? rosuvastatin (CRESTOR) 5 MG tablet Take 1 tablet (5 mg total) by mouth daily. 90 tablet 1  ? topiramate (TOPAMAX) 50 MG tablet Take 1 tablet (50 mg total) by mouth daily. 90 tablet 3  ? ?No facility-administered medications prior to visit.  ? ? ? ?EXAM: ? ?BP 112/80 (BP Location: Right Arm, Patient Position: Sitting, Cuff Size: Normal)   Pulse 81   Temp 98.5 ?F (36.9 ?C) (Oral)   Ht 5' (1.524 m)   Wt 119 lb 3.2 oz (54.1 kg)   SpO2 100%   BMI 23.28 kg/m?  ? ?Body mass index is 23.28 kg/m?. ? ?GENERAL: vitals reviewed and listed above, alert, oriented, appears well hydrated and in no acute distress ?HEENT: atraumatic, conjunctiva  clear, no obvious abnormalities on inspection of external nose and ears ears TMs clear NECK: no obvious masses on inspection palpation  ?LUNGS: clear to auscultation bilaterally, no wheezes, rales or rhonchi, good air movement ?CV: HRRR, no clubbing cyanosis or  peripheral edema nl cap refill do not hear murmur and S1-S2 appear normal.  No abdominal bruits. ?Abdomen soft without again a megaly guarding or rebound no plan to splenomegaly ?MS: moves all extremities without noticeable focal  abnormality ?Neurologic no gross focal abnormalities. ?Skin fading superficial bruise right lower extremity otherwise no extensive petechiae or bruising. ?PSYCH: pleasant and cooperative, no obvious depression or anxiety ?Lab Results  ?Component Value Date  ? WBC 6.1 01/17/2022  ? HGB 12.4 01/17/2022  ? HCT 37.9 01/17/2022  ? PLT 160.0 01/17/2022  ? GLUCOSE 99 01/17/2022  ? CHOL 168 01/17/2022  ? TRIG 63.0 01/17/2022  ? HDL 81.90 01/17/2022  ? LDLCALC 74 01/17/2022  ? ALT 14 01/06/2021  ? AST 18 01/06/2021  ? NA 135 01/17/2022  ? K 4.1 01/17/2022  ? CL 101 01/17/2022  ? CREATININE  0.91 01/17/2022  ? BUN 26 (H) 01/17/2022  ? CO2 27 01/17/2022  ? TSH 2.89 01/06/2021  ? INR 1.0 05/29/2019  ? ?BP Readings from Last 3 Encounters:  ?01/17/22 112/80  ?01/05/22 107/66  ?11/29/21 111/73  ?Labs today which will include CBC iron  panel B12 folate lipid panel and will add chemistry since it is been a while ? ?ASSESSMENT AND PLAN: ? ?Discussed the following assessment and plan: ? ?Medication management - Plan: Basic metabolic panel, Basic metabolic panel, Lipid panel ? ?Iron deficiency - Plan: Folate, Vitamin B12, IBC panel, CBC with Differential/Platelet ? ?Anemia, mild - Plan: Folate, Vitamin B12, IBC panel, CBC with Differential/Platelet ? ?[place]at[place] coronary artery calcium score less than 100 ? ?Hyperlipidemia, unspecified hyperlipidemia type - Plan: Lipid panel ? ?Thrombocytopenia (HCC) - Plan: Folate, Vitamin B12, IBC panel, CBC with Differential/Platelet ? 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 ? ?Helicobacter pylori gastritis - rx per gi  ?Overall appears to be doing well monitoring a number of conditions without symptoms. ? ? ?-Patient advised to return or notify health care team  if  new concerns arise. ? ?Patient Instructions  ?Good to see you today . ?Lab today  will add on  a bmp cause of meds . ? ?Advise yearly  cv exam and possible echo monitoring . ?Further eval if any symptoms of concern.  ? ? ?Neta Mends. Kyro Joswick M.D. ?

## 2022-01-17 NOTE — Patient Instructions (Signed)
Good to see you today . ?Lab today  will add on  a bmp cause of meds . ? ?Advise yearly  cv exam and possible echo monitoring . ?Further eval if any symptoms of concern.  ? ? ?

## 2022-01-18 NOTE — Progress Notes (Signed)
Anemia better  nl iron levels ,  ldl at goal , b12 high (  can back off on  supplements )

## 2022-03-22 ENCOUNTER — Other Ambulatory Visit: Payer: Self-pay | Admitting: Internal Medicine

## 2022-03-23 NOTE — Telephone Encounter (Signed)
Last Ov 01/17/22 Filled 01/17/22 Is it ok to refill?

## 2022-03-23 NOTE — Telephone Encounter (Signed)
I have not prescribed this medication for her . Please contact pharmacy and send to  pre scriber  and document in record

## 2022-04-12 ENCOUNTER — Other Ambulatory Visit: Payer: Self-pay | Admitting: Internal Medicine

## 2022-05-24 ENCOUNTER — Encounter: Payer: Self-pay | Admitting: Adult Health

## 2022-05-24 MED ORDER — RIZATRIPTAN BENZOATE 10 MG PO TABS: ORAL_TABLET | ORAL | 5 refills | Status: DC

## 2022-06-26 ENCOUNTER — Other Ambulatory Visit: Payer: Self-pay | Admitting: Obstetrics and Gynecology

## 2022-06-26 DIAGNOSIS — M8580 Other specified disorders of bone density and structure, unspecified site: Secondary | ICD-10-CM

## 2022-10-04 ENCOUNTER — Other Ambulatory Visit: Payer: Self-pay | Admitting: Internal Medicine

## 2022-10-08 ENCOUNTER — Other Ambulatory Visit: Payer: Self-pay | Admitting: Internal Medicine

## 2022-10-15 LAB — HM MAMMOGRAPHY

## 2022-11-02 ENCOUNTER — Ambulatory Visit (HOSPITAL_BASED_OUTPATIENT_CLINIC_OR_DEPARTMENT_OTHER)
Admission: RE | Admit: 2022-11-02 | Discharge: 2022-11-02 | Disposition: A | Discharge status: Home / Self Care | Payer: Medicare Other | Source: Ambulatory Visit | Attending: Pulmonary Disease | Admitting: Pulmonary Disease

## 2022-11-02 DIAGNOSIS — J479 Bronchiectasis, uncomplicated: Secondary | ICD-10-CM | POA: Diagnosis present

## 2022-11-04 ENCOUNTER — Other Ambulatory Visit: Payer: Self-pay | Admitting: Adult Health

## 2022-11-06 ENCOUNTER — Encounter: Payer: Self-pay | Admitting: Adult Health

## 2022-12-04 ENCOUNTER — Ambulatory Visit (INDEPENDENT_AMBULATORY_CARE_PROVIDER_SITE_OTHER): Payer: Medicare Other | Admitting: Adult Health

## 2022-12-04 ENCOUNTER — Encounter: Payer: Self-pay | Admitting: Adult Health

## 2022-12-04 VITALS — BP 107/60 | HR 74 | Wt 122.6 lb

## 2022-12-04 DIAGNOSIS — G43011 Migraine without aura, intractable, with status migrainosus: Secondary | ICD-10-CM

## 2022-12-04 MED ORDER — FLUOXETINE HCL 20 MG PO CAPS: ORAL_CAPSULE | ORAL | 11 refills | Status: DC

## 2022-12-04 MED ORDER — BACLOFEN 10 MG PO TABS: ORAL_TABLET | ORAL | 1 refills | Status: AC

## 2022-12-04 NOTE — Progress Notes (Signed)
PATIENT: Alicia Keith DOB: 1951/06/10  REASON FOR VISIT: follow up HISTORY FROM: patient  HISTORY OF PRESENT ILLNESS: Today 12/04/22:  Alicia Keith is a 72 y.o. female with a history of migraine headaches and anxiety. Returns today for follow-up.  She remains on Topamax 50 mg daily.  She states that she has a moderate headache most days and only 2-3 severe headaches a month.  For severe headache she will use rizatriptan.  She has tried and failed many medications in the past.  Has tried Botox, Ajovy and Ubrelvy in regards to some of the newer medications.  She is never tried [place].     11/29/21: Alicia Keith is a 72 year old female with a history of migraine headaches and anxiety.  She returns today for follow-up.  She is currently on Topamax.  Reports that she was able to decrease her dose to 50 mg daily.  She has approximately 4 headaches a week.  In 2-3 severe headaches a month.  She states for her severe headache she will use rizatriptan with good benefit.  She reports that reducing her dose of Topamax did not increase her headache frequency.  She is on Prozac for her anxiety.  She would like it increased to 20 mg.  States recently she has had more anxiety that has not been controlled.  05/31/21: Alicia Keith is a 72 year old female with a history of migraine headaches and anxiety.  She returns today for follow-up.  She reports that amitriptyline worked well for her.  She states that she did not have any headaches that she felt the best that she is felt in a long time however after 3 to 4 months she began to have what she describes as heart palpitation.  She did stop the medication and the symptoms resolved.  However her headaches have returned.  She states that she has almost a mild headache on a daily basis.  She typically will use Advil or baclofen.  For more severe headache she will use the Maxalt.  States that she gets 2-3 intense headaches a month.  Continues on  Topamax 50 mg twice a day  11/29/20: Alicia Keith is a 72 year old female with a history of migraine headaches and anxiety.  She returns today for follow-up.  She reports that she continues to have daily headaches.  But she only has 2-3 migraines a month.  She continues on Topamax 50 mg twice a day.  She reports that with severe headache she does take rizatriptan with good benefit.  The patient has tried multiple medications in the past for her migraines including CGRP agents, Botox and several oral medication.  The patient is currently happy with her migraine management.  At the last visit with Dr. Brett Fairy she was switched from Zoloft to Prozac.  Reports that since she started Prozac she has been getting grinding her teeth at night.  She would like to switch to another medication.  She mentions that she would like to try Celexa.  She also mentions amitriptyline or nortriptyline although she has tried this before in the past.  HISTORY 11-04-2019 (Copied from Dr.Dohmeier's note)  Have the pleasure of meeting today with Alicia Keith minimal 72 year old Caucasian patient with a history of chronic migraines without aura.  She did have status migrainosus many many times in her life but her current level of migraine frequency and intensity has been better controlled.  She has responded well to Ajovy but then the effect wore off.  In  my last visit she started Zoloft but she has developed a "aura and there is a suspicion that this may be related to the SSRI sertraline.  We are discussing today to change her to Prozac which should be used at 20 mg daily does not require any kind of washout.   By now she has failed Botox, all triptans, Ajovy, and only Roselyn Meier has not been tries.      REVIEW OF SYSTEMS: Out of a complete 14 system review of symptoms, the patient complains only of the following symptoms, and all other reviewed systems are negative.  ALLERGIES: Allergies  Allergen Reactions    Amitriptyline     palpitations   Nortriptyline     Palpitations and racing heart beat   Imipramine Rash    HOME MEDICATIONS: Outpatient Medications Prior to Visit  Medication Sig Dispense Refill   baclofen (LIORESAL) 10 MG tablet TAKE 1 TABLET BY MOUTH DAILY AS NEEDED FOR MUSCLE SPASMS. 90 each 1   COVID-19 mRNA bivalent vaccine, Pfizer, (PFIZER COVID-19 VAC BIVALENT) injection Inject into the muscle. 0.3 mL 0   Estradiol 10 MCG TABS vaginal tablet Place vaginally. Using twice weekly     FLUoxetine (PROZAC) 20 MG capsule TAKE 1 CAPSULE(20 MG) BY MOUTH DAILY 30 capsule 0   influenza vaccine adjuvanted (FLUAD QUADRIVALENT) 0.5 ML injection Inject into the muscle. 0.5 mL 0   rizatriptan (MAXALT) 10 MG tablet TAKE 1 TABLET BY MOUTH AS NEEDED FOR MIGRAINE. MAY REPEAT IN 2 HOURS IF NEEDED. Max 2 tabs / 24 hours. 12 tablet 5   rosuvastatin (CRESTOR) 5 MG tablet TAKE 1 TABLET(5 MG) BY MOUTH DAILY 90 tablet 0   topiramate (TOPAMAX) 50 MG tablet Take 1 tablet (50 mg total) by mouth daily. 90 tablet 3   ibandronate (BONIVA) 150 MG tablet Take 150 mg by mouth every 30 (thirty) days. Take in the morning with a full glass of water, on an empty stomach, and do not take anything else by mouth or lie down for the next 30 min.     No facility-administered medications prior to visit.    PAST MEDICAL HISTORY: Past Medical History:  Diagnosis Date   Allergic rhinitis, cause unspecified    Headaches, cluster    History of chicken pox    Migraine headache     PAST SURGICAL HISTORY: Past Surgical History:  Procedure Laterality Date   TONSILLECTOMY  1955    FAMILY HISTORY: Family History  Problem Relation Age of Onset   Hyperlipidemia Mother    Renal cancer Mother    Migraines Mother    Hypertension Father    Stroke Father    Migraines Maternal Aunt    Migraines Maternal Grandmother     SOCIAL HISTORY: Social History   Socioeconomic History   Marital status: Married    Spouse name: Not  on file   Number of children: 1   Years of education: 17   Highest education level: Not on file  Occupational History   Occupation: Retired    Comment: PT  Tobacco Use   Smoking status: Never   Smokeless tobacco: Never   Tobacco comments:    married, retired PT  Substance and Sexual Activity   Alcohol use: Yes    Alcohol/week: 2.0 standard drinks of alcohol    Types: 2 Glasses of wine per week    Comment: "Couple glasses of wine"   Drug use: No   Sexual activity: Not on file  Other Topics Concern  Not on file  Social History Narrative   8 hours of sleep per night   Lives with Dr. 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    Pet cat   Social Determinants of Health   Financial Resource Strain: Low Risk  (01/05/2022)   Overall Financial Resource Strain (CARDIA)    Difficulty of Paying Living Expenses: Not hard at all  Food Insecurity: No Food Insecurity (01/05/2022)   Hunger Vital Sign    Worried About Running Out of Food in the Last Year: Never true    Ran Out of Food in the Last Year: Never true  Transportation Needs: No Transportation Needs (01/04/2021)   PRAPARE - Hydrologist (Medical): No    Lack of Transportation (Non-Medical): No  Physical Activity: Sufficiently Active (01/05/2022)   Exercise Vital Sign    Days of Exercise per Week: 7 days    Minutes of Exercise per Session: 30 min  Stress: No Stress Concern Present (01/05/2022)   [place]    Feeling of Stress : Not at all  Social Connections: Moderately Integrated (01/05/2022)   Social Connection and Isolation Panel [NHANES]    Frequency of Communication with Friends and Family: More than three times a week    Frequency of Social Gatherings with Friends and Family: More than three times a week    Attends Religious Services: More than 4 times per year    Active Member of [employer] or Organizations: No    Attends English as a second language teacher Meetings: Never    Marital Status: Married  Human resources officer Violence: Not At Risk (01/05/2022)   Humiliation, Afraid, Rape, and Kick questionnaire    Fear of Current or Ex-Partner: No    Emotionally Abused: No    Physically Abused: No    Sexually Abused: No      PHYSICAL EXAM  Vitals:   12/04/22 0955  BP: 107/60  Pulse: 74  Weight: 122 lb 9.6 oz (55.6 kg)   Body mass index is 23.94 kg/m.  Generalized: Well developed, in no acute distress   Neurological examination  Mentation: Alert oriented to time, place, history taking. Follows all commands speech and language fluent Cranial nerve II-XII: Pupils were equal round reactive to light. Extraocular movements were full, visual field were full on confrontational test. Head turning and shoulder shrug  were normal and symmetric. Motor: The motor testing reveals 5 over 5 strength of all 4 extremities. Good symmetric motor tone is noted throughout.  Sensory: Sensory testing is intact to soft touch on all 4 extremities. No evidence of extinction is noted.  Coordination: Cerebellar testing reveals good finger-nose-finger and heel-to-shin bilaterally.  Gait and station: Gait is normal.   DIAGNOSTIC DATA (LABS, IMAGING, TESTING) - I reviewed patient records, labs, notes, testing and imaging myself where available.  Lab Results  Component Value Date   WBC 6.1 01/17/2022   HGB 12.4 01/17/2022   HCT 37.9 01/17/2022   MCV 84.1 01/17/2022   PLT 160.0 01/17/2022      Component Value Date/Time   NA 135 01/17/2022 1213   NA 142 03/12/2017 1346   K 4.1 01/17/2022 1213   CL 101 01/17/2022 1213   CO2 27 01/17/2022 1213   GLUCOSE 99 01/17/2022 1213   BUN 26 (H) 01/17/2022 1213   BUN 20 03/12/2017 1346   CREATININE 0.91 01/17/2022 1213   CALCIUM 9.4 01/17/2022 1213   PROT 6.3 01/06/2021 0851  PROT 6.7 03/12/2017 1346   ALBUMIN 3.8 01/06/2021 0851   ALBUMIN 4.2 03/12/2017 1346   AST 18 01/06/2021 0851   ALT 14  01/06/2021 0851   ALKPHOS 47 01/06/2021 0851   BILITOT 0.4 01/06/2021 0851   BILITOT <0.2 03/12/2017 1346   GFRNONAA 68 03/12/2017 1346   GFRAA 79 03/12/2017 1346   Lab Results  Component Value Date   CHOL 168 01/17/2022   HDL 81.90 01/17/2022   LDLCALC 74 01/17/2022   TRIG 63.0 01/17/2022   CHOLHDL 2 01/17/2022    Lab Results  Component Value Date   TSH 2.89 01/06/2021      ASSESSMENT AND PLAN 73 y.o. year old female  has a past medical history of Allergic rhinitis, cause unspecified, Headaches, cluster, History of chicken pox, and Migraine headache. here with;  1.  Migraine headaches  --Continue Topamax 50 mg daily for prevention --Continue rizatriptan for abortive therapy -- Continue baclofen as needed for abortive therapy -- Consider Qulipta 30 mg daily for prevention will call and let us know if she would like to try this  2.  Anxiety  -- Continue Prozac to 20 mg daily  Follow-up in 1 year or sooner if needed       Ward Givens, MSN, NP-C 12/04/2022, 10:12 AM [facility] [address] [place], [place] [address] [phone]

## 2022-12-04 NOTE — Patient Instructions (Addendum)
Your Plan:  Continue topamax for prevention   Continue maxalt and or Baclofen for abortive therapy Consider Qulipta      Thank you for coming to see [place] at [facility]. I hope we have been able to provide you high quality care today.  You may receive a patient satisfaction survey over the next few weeks. We would appreciate your feedback and comments so that we may continue to improve ourselves and the health of our patients.

## 2022-12-05 ENCOUNTER — Encounter: Payer: Self-pay | Admitting: Adult Health

## 2022-12-19 ENCOUNTER — Ambulatory Visit
Admission: RE | Admit: 2022-12-19 | Discharge: 2022-12-19 | Disposition: A | Discharge status: Home / Self Care | Payer: Medicare Other | Source: Ambulatory Visit | Attending: Obstetrics and Gynecology | Admitting: Obstetrics and Gynecology

## 2022-12-19 DIAGNOSIS — M8580 Other specified disorders of bone density and structure, unspecified site: Secondary | ICD-10-CM

## 2023-01-01 ENCOUNTER — Other Ambulatory Visit: Payer: Self-pay | Admitting: Internal Medicine

## 2023-01-01 ENCOUNTER — Telehealth: Payer: Self-pay | Admitting: Internal Medicine

## 2023-01-01 NOTE — Telephone Encounter (Signed)
Contacted [name] to schedule their annual wellness visit. Appointment made for 01/08/23. Rudell Cobb AWV direct phone # [phone]   Due to schedule change moved 4/23 awv appt to [employer] schedule

## 2023-01-08 ENCOUNTER — Telehealth (INDEPENDENT_AMBULATORY_CARE_PROVIDER_SITE_OTHER): Payer: Medicare Other | Admitting: Family Medicine

## 2023-01-08 DIAGNOSIS — Z0000 Encounter for general adult medical examination without abnormal findings: Secondary | ICD-10-CM

## 2023-01-08 NOTE — Patient Instructions (Addendum)
I really enjoyed getting to talk with you today! I am available on Tuesdays and Thursdays for virtual visits if you have any questions or concerns, or if I can be of any further assistance.   CHECKLIST FROM ANNUAL WELLNESS VISIT:  -Follow up (please call to schedule if not scheduled after visit):   -yearly for annual wellness visit with primary care office and schedule you yearly in office follow up  -please bring a copy of your advance directives to the office if you can on your next visit  Here is a list of your preventive care/health maintenance measures and the plan for each if any are due:  PLAN For any measures below that may be due:  -I asked my nurse to request copies of your latest mammogram, bone density and colonoscopy to update in our records  Health Maintenance  Topic Date Due   MAMMOGRAM  08/18/2020   COVID-19 Vaccine (6 - 2023-24 season) 09/06/2022   INFLUENZA VACCINE  04/18/2023   Medicare Annual Wellness (AWV)  01/08/2024   COLONOSCOPY (Pts 45-35yrs Insurance coverage will need to be confirmed)  01/18/2025   DTaP/Tdap/Td (3 - Td or Tdap) 03/26/2028   Pneumonia Vaccine 22+ Years old  Completed   DEXA SCAN  Completed   Hepatitis C Screening  Completed   Zoster Vaccines- Shingrix  Completed   HPV VACCINES  Aged Out    -See a dentist at least yearly  -Get your eyes checked and then per your eye specialist's recommendations  -Other issues addressed today:  -advise no more than one alcoholic beverage in any given 24 hour period   -I have included below further information regarding a healthy whole foods based diet, physical activity guidelines for adults, stress management and opportunities for social connections. I hope you find this information useful.    -----------------------------------------------------------------------------------------------------------------------------------------------------------------------------------------------------------------------------------------------------------  NUTRITION: -eat real food: lots of colorful vegetables (half the plate) and fruits -5-7 servings of vegetables and fruits per day (fresh or steamed is best), exp. 2 servings of vegetables with lunch and dinner and 2 servings of fruit per day. Berries and greens such as kale and collards are great choices.  -consume on a regular basis: whole grains (make sure first ingredient on label contains the word "whole"), fresh fruits, fish, nuts, seeds, healthy oils (such as olive oil, avocado oil, grape seed oil) -may eat small amounts of dairy and lean meat on occasion, but avoid processed meats such as ham, bacon, lunch meat, etc. -drink water -try to avoid fast food and pre-packaged foods, processed meat -most experts advise limiting sodium to <  per day, should limit further is any chronic conditions such as high blood pressure, heart disease, diabetes, etc. The American Heart Association advised that <  is is ideal -try to avoid foods that contain any ingredients with names you do not recognize  -try to avoid sugar/sweets (except for the natural sugar that occurs in fresh fruit) -try to avoid sweet drinks -try to avoid white rice, white bread, pasta (unless whole grain), white or yellow potatoes  EXERCISE GUIDELINES FOR ADULTS: -if you wish to increase your physical activity, do so gradually and with the approval of your doctor -STOP and seek medical care immediately if you have any chest pain, chest discomfort or trouble breathing when starting or increasing exercise  -move and stretch your body, legs, feet and arms when sitting for long periods -Physical activity guidelines for optimal health in adults: -least 150 minutes per week of  aerobic exercise (  can talk, but not sing) once approved by your doctor, 20-30 minutes of sustained activity or two 10 minute episodes of sustained activity every day.  -resistance training at least 2 days per week if approved by your doctor -balance exercises 3+ days per week:   Stand somewhere where you have something sturdy to hold onto if you lose balance.    1) lift up on toes, start with 5x per day and work up to 20x   2) stand and lift on leg straight out to the side so that foot is a few inches of the floor, start with 5x each side and work up to 20x each side   3) stand on one foot, start with 5 seconds each side and work up to 20 seconds on each side  If you need ideas or help with getting more active:  -Silver sneakers https://tools.silversneakers.com  -Walk with a Doc: http://www.duncan-williams.com/  -try to include resistance (weight lifting/strength building) and balance exercises twice per week: or the following link for ideas: http://castillo-powell.com/  BuyDucts.dk  STRESS MANAGEMENT: -can try meditating, or just sitting quietly with deep breathing while intentionally relaxing all parts of your body for 5 minutes daily -if you need further help with stress, anxiety or depression please follow up with your primary doctor or contact the wonderful folks at [facility]: [phone]  SOCIAL CONNECTIONS: -options in [place] if you wish to engage in more social and exercise related activities:  -Silver sneakers https://tools.silversneakers.com  -Walk with a Doc: http://www.duncan-williams.com/  -Check out the [employer] Active Adults 50+ section on the [place] [employer] (hiking clubs, book clubs, cards and games, chess, exercise classes, aquatic classes and much more) - see the website for  details: https://www.[place]-.gov/departments/parks-recreation/active-adults50  -YouTube has lots of exercise videos for different ages and abilities as well  -[facility] (a variety of indoor and outdoor inperson activities for adults). [phone]. [address]  -Virtual Online Classes (a variety of topics): see seniorplanet.org or call [phone]  -consider volunteering at a school, hospice center, church, senior center or elsewhere

## 2023-01-08 NOTE — Progress Notes (Signed)
PATIENT CHECK-IN and HEALTH RISK ASSESSMENT QUESTIONNAIRE:  -completed by phone/video for upcoming Medicare Preventive Visit  Pre-Visit Check-in: 1)Vitals (height, wt, BP, etc) - record in vitals section for visit on day of visit 2)Review and Update Medications, Allergies PMH, Surgeries, Social history in Epic 3)Hospitalizations in the last year with date/reason? none  4)Review and Update Care Team (patient's specialists) in Epic 5) Complete PHQ9 in Epic  6) Complete Fall Screening in Epic 7)Review all Health Maintenance Due and order under PCP if not done.  8)Medicare Wellness Questionnaire: Answer theses question about your habits: Do you drink alcohol? Yes, 2 drinks max when drinks Have you ever smoked? no Quit date if applicable? N/a  How many packs a day do/did you smoke? no Do you use smokeless tobacco?no Do you use an illicit drugs?no Do you exercises?  Yes IF so, what type and how many days/minutes per week? Walk daily for 30 minutes, cycles, weights 2x per week  Are you sexually active? No  Typical breakfast: cup of coffee and toast  Typical lunch: apple and cheese Typical dinner: lots of veggies, trying to avoid red meat, 3x per week vegetarian meals, chicken or fish the reast of the meals Typical snacks: trail mix, seeds, nuts, whole grains, fruit  Beverages: tea, coffee, water  Answer theses question about you: Can you perform most household chores? yes Do you find it hard to follow a conversation in a noisy room?none Do you often ask people to speak up or repeat themselves?no Do you feel that you have a problem with memory?no Do you balance your checkbook and or bank acounts? yes Do you feel safe at home?yes Last dentist visit? Regular, last visit in January Do you need assistance with any of the following: Please note if so - none  Driving?  Feeding yourself?  Getting from bed to chair?  Getting to the toilet?  Bathing or showering?  Dressing  yourself?  Managing money?  Climbing a flight of stairs  Preparing meals?  Do you have Advanced Directives in place (Living Will, Healthcare Power or Attorney)?  Yes - uptodate, living will   Last eye Exam and location? Does regular visit yearly, [facility]    Do you currently use prescribed or non-prescribed narcotic or opioid pain medications? no  Do you have a history or close family history of breast, ovarian, tubal or peritoneal cancer or a family member with BRCA (breast cancer susceptibility 1 and 2) gene mutations? none  Nurse/Assistant Credentials/time stamp:   ----------------------------------------------------------------------------------------------------------------------------------------------------------------------------------------------------------------------   MEDICARE ANNUAL PREVENTIVE VISIT WITH PROVIDER: (Welcome to [employer], initial annual wellness or annual wellness exam)  Virtual Visit via Video Note  I connected with Alicia Keith on 01/08/23 by a video enabled telemedicine application and verified that I am speaking with the correct person using two identifiers.  Location patient: home Location provider:work or home office Persons participating in the virtual visit: patient, provider  Concerns and/or follow up today: Reports is doing great, no concerns. Off of boniva and bone density stable - follows with Dr. Estanislado Pandy.   See HM section in Epic for other details of completed HM.    ROS: negative for report of fevers, unintentional weight loss, vision changes, vision loss, hearing loss or change, chest pain, sob, hemoptysis, melena, hematochezia, hematuria, falls, bleeding or bruising, thoughts of suicide or self harm, memory loss  Patient-completed extensive health risk assessment - reviewed and discussed with the patient: See Health Risk Assessment completed with patient prior to the visit  either above or in recent phone note. This was reviewed  in detailed with the patient today and appropriate recommendations, orders and referrals were placed as needed per Summary below and patient instructions.   Review of Medical History: -PMH, PSH, Family History and current specialty and care providers reviewed and updated and listed below   Patient Care Team: Panosh, Neta Mends, MD as PCP - General (Internal Medicine) Santiago Glad, MD (Neurology) Silverio Lay, MD (Obstetrics and Gynecology) Gastroenterology, Deboraha Sprang   Past Medical History:  Diagnosis Date   Allergic rhinitis, cause unspecified    Headaches, cluster    History of chicken pox    Migraine headache     Past Surgical History:  Procedure Laterality Date   TONSILLECTOMY  1955    Social History   Socioeconomic History   Marital status: Married    Spouse name: Not on file   Number of children: 1   Years of education: 17   Highest education level: Not on file  Occupational History   Occupation: Retired    Comment: PT  Tobacco Use   Smoking status: Never   Smokeless tobacco: Never   Tobacco comments:    married, retired PT  Substance and Sexual Activity   Alcohol use: Yes    Alcohol/week: 2.0 standard drinks of alcohol    Types: 2 Glasses of wine per week    Comment: "Couple glasses of wine"   Drug use: No   Sexual activity: Not on file  Other Topics Concern   Not on file  Social History Narrative   8 hours of sleep per night   Lives with Dr. 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    Pet cat   Social Determinants of Health   Financial Resource Strain: Low Risk  (01/05/2022)   Overall Financial Resource Strain (CARDIA)    Difficulty of Paying Living Expenses: Not hard at all  Food Insecurity: No Food Insecurity (01/05/2022)   Hunger Vital Sign    Worried About Running Out of Food in the Last Year: Never true    Ran Out of Food in the Last Year: Never true  Transportation Needs: No Transportation Needs (01/04/2021)   PRAPARE -  Administrator, Civil Service (Medical): No    Lack of Transportation (Non-Medical): No  Physical Activity: Sufficiently Active (01/05/2022)   Exercise Vital Sign    Days of Exercise per Week: 7 days    Minutes of Exercise per Session: 30 min  Stress: No Stress Concern Present (01/05/2022)   [employer] of Occupational Health - Occupational Stress Questionnaire    Feeling of Stress : Not at all  Social Connections: Moderately Integrated (01/05/2022)   Social Connection and Isolation Panel [NHANES]    Frequency of Communication with Friends and Family: More than three times a week    Frequency of Social Gatherings with Friends and Family: More than three times a week    Attends Religious Services: More than 4 times per year    Active Member of [employer] or Organizations: No    Attends Banker Meetings: Never    Marital Status: Married  Catering manager Violence: Not At Risk (01/05/2022)   Humiliation, Afraid, Rape, and Kick questionnaire    Fear of Current or Ex-Partner: No    Emotionally Abused: No    Physically Abused: No    Sexually Abused: No    Family History  Problem Relation Age of Onset   Hyperlipidemia Mother  Renal cancer Mother    Migraines Mother    Hypertension Father    Stroke Father    Migraines Maternal Aunt    Migraines Maternal Grandmother     Current Outpatient Medications on File Prior to Visit  Medication Sig Dispense Refill   baclofen (LIORESAL) 10 MG tablet TAKE 1 TABLET BY MOUTH DAILY AS NEEDED FOR MUSCLE SPASMS. 90 each 1   COVID-19 mRNA bivalent vaccine, Pfizer, (PFIZER COVID-19 VAC BIVALENT) injection Inject into the muscle. 0.3 mL 0   Estradiol 10 MCG TABS vaginal tablet Place vaginally. Using twice weekly     FLUoxetine (PROZAC) 20 MG capsule TAKE 1 CAPSULE(20 MG) BY MOUTH DAILY 30 capsule 11   ibandronate (BONIVA) 150 MG tablet Take 150 mg by mouth every 30 (thirty) days. Take in the morning with a full glass of water, on  an empty stomach, and do not take anything else by mouth or lie down for the next 30 min.     influenza vaccine adjuvanted (FLUAD QUADRIVALENT) 0.5 ML injection Inject into the muscle. 0.5 mL 0   rizatriptan (MAXALT) 10 MG tablet TAKE 1 TABLET BY MOUTH AS NEEDED FOR MIGRAINE. MAY REPEAT IN 2 HOURS IF NEEDED. Max 2 tabs / 24 hours. 12 tablet 5   rosuvastatin (CRESTOR) 5 MG tablet TAKE 1 TABLET(5 MG) BY MOUTH DAILY 90 tablet 0   topiramate (TOPAMAX) 50 MG tablet Take 1 tablet (50 mg total) by mouth daily. 90 tablet 3   No current facility-administered medications on file prior to visit.    Allergies  Allergen Reactions   Amitriptyline     palpitations   Nortriptyline     Palpitations and racing heart beat   Imipramine Rash       Physical Exam There were no vitals filed for this visit. Estimated body mass index is 23.94 kg/m as calculated from the following:   Height as of 01/17/22: 5' (1.524 m).   Weight as of 12/04/22: 122 lb 9.6 oz (55.6 kg).  EKG (optional): deferred due to virtual visit  GENERAL: alert, oriented, no acute distress detected, full vision exam deferred due to pandemic and/or virtual encounter  HEENT: atraumatic, conjunttiva clear, no obvious abnormalities on inspection of external nose and ears  NECK: normal movements of the head and neck  LUNGS: on inspection no signs of respiratory distress, breathing rate appears normal, no obvious gross SOB, gasping or wheezing  CV: no obvious cyanosis  MS: moves all visible extremities without noticeable abnormality  PSYCH/NEURO: pleasant and cooperative, no obvious depression or anxiety, speech and thought processing grossly intact, Cognitive function grossly intact  Flowsheet Row Office Visit from [facility]/17/2022 in [facility]  PHQ-9 Total Score 1           01/08/2023   11:38 AM 01/17/2022   11:52 AM 01/05/2022   11:15 AM 01/04/2021    2:14 PM 03/26/2018    3:22 PM  Depression screen PHQ  2/9  Decreased Interest 0 0 0 0 0  Down, Depressed, Hopeless 0 0 0 0 0  PHQ - 2 Score 0 0 0 0 0  Altered sleeping  0     Tired, decreased energy  1     Change in appetite  0     Feeling bad or failure about yourself   0     Trouble concentrating  0     Moving slowly or fidgety/restless  0     Suicidal thoughts  0  PHQ-9 Score  1     Difficult doing work/chores  Not difficult at all          01/04/2021    2:12 PM 01/05/2022    9:34 AM 01/05/2022   11:17 AM 01/17/2022   11:52 AM 01/08/2023   11:38 AM  Fall Risk  Falls in the past year? 0 0 0 0   Was there an injury with Fall? 0  0 0 0  Fall Risk Category Calculator 0  0 0   Fall Risk Category (Retired) Low  Low Low   (RETIRED) Patient Fall Risk Level Low fall risk  Low fall risk Low fall risk   Patient at Risk for Falls Due to   No Fall Risks No Fall Risks No Fall Risks  Fall risk Follow up    Falls evaluation completed Falls evaluation completed     SUMMARY AND PLAN:  Encounter for Medicare annual wellness exam    Discussed applicable health maintenance/preventive health measures and advised and referred or ordered per patient preferences: -she reports she had her colonoscopy in 2023 and it was normal (had h. Pylori), requested nursing staff to obtain copy and abstract -she reports she had her mammogram and bone density tests recently with Dr. Estanislado Pandy, requested nursing staff to obtain and abstract -discussed [phone] vaccine recommendations, can get boosters at pharmacy when due  Health Maintenance  Topic Date Due   MAMMOGRAM  08/18/2020   COVID-19 Vaccine (6 - 2023-24 season) 09/06/2022   INFLUENZA VACCINE  04/18/2023   Medicare Annual Wellness (AWV)  01/08/2024   COLONOSCOPY (Pts 45-55yrs Insurance coverage will need to be confirmed)  01/18/2025   DTaP/Tdap/Td (3 - Td or Tdap) 03/26/2028   Pneumonia Vaccine 69+ Years old  Completed   DEXA SCAN  Completed   Hepatitis C Screening  Completed   Zoster Vaccines-  Shingrix  Completed   HPV VACCINES  Aged [employer] and counseling on the following was provided based on the above review of health and a plan/checklist for the patient, along with additional information discussed, was provided for the patient in the patient instructions :  -Requested pt bring copy of advanced directives to PCP office to scan into system Reviewed and demonstrated safe balance exercises that can be done at home to improve balance and discussed exercise guidelines for adults with include balance exercises at least 3 days per week.  -Advised and counseled on a healthy lifestyle - including the importance of a healthy diet, regular physical activity -information on social connections and stress management provided in patient instructions -Reviewed patient's current diet. Advised and counseled on a whole foods based healthy diet. A summary of a healthy diet was provided in the Patient Instructions.  -reviewed patient's current physical activity level and discussed exercise guidelines for adults. Discussed community resources and ideas for safe exercise at home to assist in meeting exercise guideline recommendations in a safe and healthy way.  -Advise yearly dental visits at minimum and regular eye exams -Advised and counseled on alcohol safe limits, risks  Follow up: see patient instructions     Patient Instructions  I really enjoyed getting to talk with you today! I am available on Tuesdays and Thursdays for virtual visits if you have any questions or concerns, or if I can be of any further assistance.   CHECKLIST FROM ANNUAL WELLNESS VISIT:  -Follow up (please call to schedule if not scheduled after visit):   -yearly for annual wellness visit with primary  care office  Here is a list of your preventive care/health maintenance measures and the plan for each if any are due:  PLAN For any measures below that may be due:  -I asked my nurse to request copies of your latest  mammogram, bone density and colonoscopy to update in our records  Health Maintenance  Topic Date Due   MAMMOGRAM  08/18/2020   COVID-19 Vaccine (6 - 2023-24 season) 09/06/2022   INFLUENZA VACCINE  04/18/2023   Medicare Annual Wellness (AWV)  01/08/2024   COLONOSCOPY (Pts 45-60yrs Insurance coverage will need to be confirmed)  01/18/2025   DTaP/Tdap/Td (3 - Td or Tdap) 03/26/2028   Pneumonia Vaccine 45+ Years old  Completed   DEXA SCAN  Completed   Hepatitis C Screening  Completed   Zoster Vaccines- Shingrix  Completed   HPV VACCINES  Aged Out    -See a dentist at least yearly  -Get your eyes checked and then per your eye specialist's recommendations  -Other issues addressed today:  -advise no more than one alcoholic beverage in any given 24 hour period   -I have included below further information regarding a healthy whole foods based diet, physical activity guidelines for adults, stress management and opportunities for social connections. I hope you find this information useful.   -----------------------------------------------------------------------------------------------------------------------------------------------------------------------------------------------------------------------------------------------------------  NUTRITION: -eat real food: lots of colorful vegetables (half the plate) and fruits -5-7 servings of vegetables and fruits per day (fresh or steamed is best), exp. 2 servings of vegetables with lunch and dinner and 2 servings of fruit per day. Berries and greens such as kale and collards are great choices.  -consume on a regular basis: whole grains (make sure first ingredient on label contains the word "whole"), fresh fruits, fish, nuts, seeds, healthy oils (such as olive oil, avocado oil, grape seed oil) -may eat small amounts of dairy and lean meat on occasion, but avoid processed meats such as ham, bacon, lunch meat, etc. -drink water -try to avoid fast  food and pre-packaged foods, processed meat -most experts advise limiting sodium to < 2300mg  per day, should limit further is any chronic conditions such as high blood pressure, heart disease, diabetes, etc. The American Heart Association advised that < 1500mg  is is ideal -try to avoid foods that contain any ingredients with names you do not recognize  -try to avoid sugar/sweets (except for the natural sugar that occurs in fresh fruit) -try to avoid sweet drinks -try to avoid white rice, white bread, pasta (unless whole grain), white or yellow potatoes  EXERCISE GUIDELINES FOR ADULTS: -if you wish to increase your physical activity, do so gradually and with the approval of your doctor -STOP and seek medical care immediately if you have any chest pain, chest discomfort or trouble breathing when starting or increasing exercise  -move and stretch your body, legs, feet and arms when sitting for long periods -Physical activity guidelines for optimal health in adults: -least 150 minutes per week of aerobic exercise (can talk, but not sing) once approved by your doctor, 20-30 minutes of sustained activity or two 10 minute episodes of sustained activity every day.  -resistance training at least 2 days per week if approved by your doctor -balance exercises 3+ days per week:   Stand somewhere where you have something sturdy to hold onto if you lose balance.    1) lift up on toes, start with 5x per day and work up to 20x   2) stand and lift on leg straight out to  the side so that foot is a few inches of the floor, start with 5x each side and work up to 20x each side   3) stand on one foot, start with 5 seconds each side and work up to 20 seconds on each side  If you need ideas or help with getting more active:  -Silver sneakers https://tools.silversneakers.com  -Walk with a Doc: http://www.duncan-williams.com/  -try to include resistance (weight lifting/strength building) and balance exercises twice per  week: or the following link for ideas: http://castillo-powell.com/  BuyDucts.dk  STRESS MANAGEMENT: -can try meditating, or just sitting quietly with deep breathing while intentionally relaxing all parts of your body for 5 minutes daily -if you need further help with stress, anxiety or depression please follow up with your primary doctor or contact the wonderful folks at [facility]: [phone]  SOCIAL CONNECTIONS: -options in [place] if you wish to engage in more social and exercise related activities:  -Silver sneakers https://tools.silversneakers.com  -Walk with a Doc: http://www.duncan-williams.com/  -Check out the [facility] Active Adults 50+ section on the [place] [employer] (hiking clubs, book clubs, cards and games, chess, exercise classes, aquatic classes and much more) - see the website for details: https://www.[place]-.gov/departments/parks-recreation/active-adults50  -YouTube has lots of exercise videos for different ages and abilities as well  -[facility] (a variety of indoor and outdoor inperson activities for adults). (334)725-3467. [address]  -Virtual Online Classes (a variety of topics): see seniorplanet.org or call ([phone]  -consider volunteering at a school, hospice center, church, senior center or elsewhere           Terressa Koyanagi, DO

## 2023-03-31 ENCOUNTER — Other Ambulatory Visit: Payer: Self-pay | Admitting: Family

## 2023-04-04 ENCOUNTER — Other Ambulatory Visit: Payer: Self-pay | Admitting: Internal Medicine

## 2023-04-05 ENCOUNTER — Other Ambulatory Visit: Payer: Self-pay | Admitting: Internal Medicine

## 2023-04-05 ENCOUNTER — Encounter: Payer: Self-pay | Admitting: Internal Medicine

## 2023-04-05 MED ORDER — ROSUVASTATIN CALCIUM 5 MG PO TABS: 5.0000 mg | ORAL_TABLET | Freq: Every day | ORAL | 0 refills | Status: DC

## 2023-05-02 ENCOUNTER — Encounter (INDEPENDENT_AMBULATORY_CARE_PROVIDER_SITE_OTHER): Payer: Self-pay

## 2023-05-12 ENCOUNTER — Other Ambulatory Visit: Payer: Self-pay | Admitting: Internal Medicine

## 2023-05-21 ENCOUNTER — Ambulatory Visit (INDEPENDENT_AMBULATORY_CARE_PROVIDER_SITE_OTHER): Payer: Medicare Other | Admitting: Internal Medicine

## 2023-05-21 ENCOUNTER — Encounter: Payer: Self-pay | Admitting: Internal Medicine

## 2023-05-21 VITALS — BP 106/62 | HR 76 | Temp 97.8°F | Ht 60.2 in | Wt 120.0 lb

## 2023-05-21 DIAGNOSIS — D696 Thrombocytopenia, unspecified: Secondary | ICD-10-CM

## 2023-05-21 DIAGNOSIS — E785 Hyperlipidemia, unspecified: Secondary | ICD-10-CM | POA: Diagnosis not present

## 2023-05-21 DIAGNOSIS — E611 Iron deficiency: Secondary | ICD-10-CM | POA: Diagnosis not present

## 2023-05-21 DIAGNOSIS — Z79899 Other long term (current) drug therapy: Secondary | ICD-10-CM | POA: Diagnosis not present

## 2023-05-21 DIAGNOSIS — I77810 Thoracic aortic ectasia: Secondary | ICD-10-CM

## 2023-05-21 DIAGNOSIS — R931 Abnormal findings on diagnostic imaging of heart and coronary circulation: Secondary | ICD-10-CM

## 2023-05-21 LAB — COMPREHENSIVE METABOLIC PANEL
ALT: 17 U/L (ref 0–35)
AST: 20 U/L (ref 0–37)
Albumin: 4.1 g/dL (ref 3.5–5.2)
Alkaline Phosphatase: 60 U/L (ref 39–117)
BUN: 28 mg/dL — ABNORMAL HIGH (ref 6–23)
CO2: 27 meq/L (ref 19–32)
Calcium: 9.3 mg/dL (ref 8.4–10.5)
Chloride: 101 meq/L (ref 96–112)
Creatinine, Ser: 0.93 mg/dL (ref 0.40–1.20)
GFR: 60.76 mL/min (ref >60.00)
Glucose, Bld: 86 mg/dL (ref 70–99)
Potassium: 4 meq/L (ref 3.5–5.1)
Sodium: 136 meq/L (ref 135–145)
Total Bilirubin: 0.4 mg/dL (ref 0.2–1.2)
Total Protein: 7.2 g/dL (ref 6.0–8.3)

## 2023-05-21 LAB — CBC WITH DIFFERENTIAL/PLATELET
Basophils Absolute: 0.1 10*3/uL (ref 0.0–0.1)
Basophils Relative: 1.3 % (ref 0.0–3.0)
Eosinophils Absolute: 0.3 10*3/uL (ref 0.0–0.7)
Eosinophils Relative: 4.1 % (ref 0.0–5.0)
HCT: 37.6 % (ref 36.0–46.0)
Hemoglobin: 12.1 g/dL (ref 12.0–15.0)
Lymphocytes Relative: 24.6 % (ref 12.0–46.0)
Lymphs Abs: 1.5 10*3/uL (ref 0.7–4.0)
MCHC: 32.2 g/dL (ref 30.0–36.0)
MCV: 86.8 fl (ref 78.0–100.0)
Monocytes Absolute: 0.5 10*3/uL (ref 0.1–1.0)
Monocytes Relative: 8.7 % (ref 3.0–12.0)
Neutro Abs: 3.8 10*3/uL (ref 1.4–7.7)
Neutrophils Relative %: 61.3 % (ref 43.0–77.0)
Platelets: 163 10*3/uL (ref 150.0–400.0)
RBC: 4.34 Mil/uL (ref 3.87–5.11)
RDW: 13.9 % (ref 11.5–15.5)
WBC: 6.2 10*3/uL (ref 4.0–10.5)

## 2023-05-21 LAB — LIPID PANEL
Cholesterol: 172 mg/dL (ref 0–200)
HDL: 74.3 mg/dL (ref >39.00)
LDL Cholesterol: 80 mg/dL (ref 0–99)
NonHDL: 97.48
Total CHOL/HDL Ratio: 2
Triglycerides: 88 mg/dL (ref 0.0–149.0)
VLDL: 17.6 mg/dL (ref 0.0–40.0)

## 2023-05-21 LAB — HEMOGLOBIN A1C: Hgb A1c MFr Bld: 5.9 % (ref 4.6–6.5)

## 2023-05-21 NOTE — Progress Notes (Signed)
Chief Complaint  Patient presents with   Annual Exam    HPI: Patient  BEVERELY HESSON  72 y.o. comes in today for yearly check evaluation Hx of migraines under care  use of triptans about 2 x per month  Ascending aorta dilitation incidental finding but last cta showed 40 mm no av abnormality  Hx ct score  calcium 41 %ile  rOSUVASTATIN 5 mg   no concerns  Cough   from bronchiecstis butstabel Boniva  :stopped taking  because was getting   vasculitis  . Marland Kitchen Was the final culprit to the dec plt and rash. Doing well now   Health Maintenance  Topic Date Due   COVID-19 Vaccine (6 - 2023-24 season) 08/20/2023 (Originally 05/19/2023)   MAMMOGRAM  10/16/2023   Medicare Annual Wellness (AWV)  01/08/2024   DTaP/Tdap/Td (3 - Td or Tdap) 03/26/2028   Colonoscopy  12/22/2031   Pneumonia Vaccine 29+ Years old  Completed   INFLUENZA VACCINE  Completed   DEXA SCAN  Completed   Hepatitis C Screening  Completed   Zoster Vaccines- Shingrix  Completed   HPV VACCINES  Aged Out   Health Maintenance Review LIFESTYLE:  Exercise:  walk and 30 - 45  minues  ocass knees. Tobacco/ETS: no Alcohol:  2+ per week  Sugar beverages:no Sleep: at least 8 hours  Drug use: no HH of  2   pet 3 cats    ROS:  REST of 12 system review negative except as per HPI no cv new pulm sx or exercise intolerance  check  scaly bump  R shoulder   Past Medical History:  Diagnosis Date   Allergic rhinitis, cause unspecified    Headaches, cluster    History of chicken pox    Migraine headache     Past Surgical History:  Procedure Laterality Date   TONSILLECTOMY  1955    Family History  Problem Relation Age of Onset   Hyperlipidemia Mother    Renal cancer Mother    Migraines Mother    Hypertension Father    Stroke Father    Migraines Maternal Aunt    Migraines Maternal Grandmother     Social History   Socioeconomic History   Marital status: Married    Spouse name: Not on file   Number of children: 1    Years of education: 17   Highest education level: Not on file  Occupational History   Occupation: Retired    Comment: PT  Tobacco Use   Smoking status: Never   Smokeless tobacco: Never   Tobacco comments:    married, retired PT  Substance and Sexual Activity   Alcohol use: Yes    Alcohol/week: 2.0 standard drinks of alcohol    Types: 2 Glasses of wine per week    Comment: "Couple glasses of wine"   Drug use: No   Sexual activity: Not on file  Other Topics Concern   Not on file  Social History Narrative   8 hours of sleep per night   Lives with Dr. 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    Pet cat   Social Determinants of Health   Financial Resource Strain: Low Risk  (01/05/2022)   Overall Financial Resource Strain (CARDIA)    Difficulty of Paying Living Expenses: Not hard at all  Food Insecurity: No Food Insecurity (05/21/2023)   Hunger Vital Sign    Worried About Running Out of Food in the Last Year: Never true  Ran Out of Food in the Last Year: Never true  Transportation Needs: No Transportation Needs (01/04/2021)   PRAPARE - Administrator, Civil Service (Medical): No    Lack of Transportation (Non-Medical): No  Physical Activity: Sufficiently Active (05/21/2023)   Exercise Vital Sign    Days of Exercise per Week: 6 days    Minutes of Exercise per Session: 40 min  Stress: No Stress Concern Present (05/21/2023)   [employer] of Occupational Health - Occupational Stress Questionnaire    Feeling of Stress : Not at all  Social Connections: Moderately Isolated (05/21/2023)   Social Connection and Isolation Panel [NHANES]    Frequency of Communication with Friends and Family: More than three times a week    Frequency of Social Gatherings with Friends and Family: Three times a week    Attends Religious Services: Never    Active Member of Clubs or Organizations: No    Attends Banker Meetings: Never    Marital Status: Married     Outpatient Medications Prior to Visit  Medication Sig Dispense Refill   baclofen (LIORESAL) 10 MG tablet TAKE 1 TABLET BY MOUTH DAILY AS NEEDED FOR MUSCLE SPASMS. 90 each 1   COVID-19 mRNA bivalent vaccine, Pfizer, (PFIZER COVID-19 VAC BIVALENT) injection Inject into the muscle. 0.3 mL 0   Estradiol 10 MCG TABS vaginal tablet Place vaginally. Using twice weekly     FLUoxetine (PROZAC) 20 MG capsule TAKE 1 CAPSULE(20 MG) BY MOUTH DAILY 30 capsule 11   influenza vaccine adjuvanted (FLUAD QUADRIVALENT) 0.5 ML injection Inject into the muscle. 0.5 mL 0   rizatriptan (MAXALT) 10 MG tablet TAKE 1 TABLET BY MOUTH AS NEEDED FOR MIGRAINE. MAY REPEAT IN 2 HOURS IF NEEDED. Max 2 tabs / 24 hours. 12 tablet 5   rosuvastatin (CRESTOR) 5 MG tablet TAKE 1 TABLET(5 MG) BY MOUTH DAILY 90 tablet 0   topiramate (TOPAMAX) 50 MG tablet Take 1 tablet (50 mg total) by mouth daily. 90 tablet 3   ibandronate (BONIVA) 150 MG tablet Take 150 mg by mouth every 30 (thirty) days. Take in the morning with a full glass of water, on an empty stomach, and do not take anything else by mouth or lie down for the next 30 min.     No facility-administered medications prior to visit.     EXAM:  BP 106/62 (BP Location: Left Arm, Patient Position: Sitting, Cuff Size: Normal)   Pulse 76   Temp 97.8 F (36.6 C) (Oral)   Ht 5' 0.2" (1.529 m)   Wt 120 lb (54.4 kg)   SpO2 98%   BMI 23.28 kg/m   Body mass index is 23.28 kg/m. Wt Readings from Last 3 Encounters:  05/21/23 120 lb (54.4 kg)  12/04/22 122 lb 9.6 oz (55.6 kg)  01/17/22 119 lb 3.2 oz (54.1 kg)    Physical Exam: Vital signs reviewed ZOX:WRUE is a well-developed well-nourished alert cooperative    who appearsr stated age in no acute distress.  HEENT: normocephalic atraumatic , Eyes: PERRL EOM's full, conjunctiva clear, Nares: paten,t no deformity discharge or tenderness., Ears: no deformity EAC's clear TMs with normal landmarks. Mouth: clear OP, no lesions,  edema.  Moist mucous membranes. Dentition in adequate repair. NECK: supple without masses, thyromegaly or bruits. CHEST/PULM:  Clear to auscultation and percussion breath sounds equal no wheeze , rales or rhonchi. No chest wall deformities or tenderness. Breast: normal by inspection . No dimpling, discharge, masses, tenderness or discharge .  CV: PMI is nondisplaced, S1 S2 no gallops, murmurs, rubs. Peripheral pulses are full without delay.No JVD .  ABDOMEN: Bowel sounds normal nontender  No guard or rebound, no hepato splenomegal no CVA tenderness.   Extremtities:  No clubbing cyanosis or edema, no acute joint swelling or redness no focal atrophy NEURO:  Oriented x3, cranial nerves 3-12 appear to be intact, no obvious focal weakness,gait within normal limits no abnormal reflexes or asymmetrical SKIN: No acute rashes normal turgor, color, no bruising or petechiae. One 2 mm mildly scaly flesh colored mole vs ware right shoulder symmetrical  PSYCH: Oriented, good eye contact, no obvious depression anxiety, cognition and judgment appear normal. LN: no cervical axillary adenopathy  Lab Results  Component Value Date   WBC 6.1 01/17/2022   HGB 12.4 01/17/2022   HCT 37.9 01/17/2022   PLT 160.0 01/17/2022   GLUCOSE 99 01/17/2022   CHOL 168 01/17/2022   TRIG 63.0 01/17/2022   HDL 81.90 01/17/2022   LDLCALC 74 01/17/2022   ALT 14 01/06/2021   AST 18 01/06/2021   NA 135 01/17/2022   K 4.1 01/17/2022   CL 101 01/17/2022   CREATININE 0.91 01/17/2022   BUN 26 (H) 01/17/2022   CO2 27 01/17/2022   TSH 2.89 01/06/2021   INR 1.0 05/29/2019    BP Readings from Last 3 Encounters:  05/21/23 106/62  12/04/22 107/60  01/17/22 112/80    Lab plan  reviewed with patient   ASSESSMENT AND PLAN:  Discussed the following assessment and plan:    ICD-10-CM   1. Hyperlipidemia, unspecified hyperlipidemia type  E78.5 CBC with Differential/Platelet    Comprehensive metabolic panel    Lipid panel     TSH    Hemoglobin A1c    Lipoprotein A (LPA)    IBC + Ferritin   cont rosuvastatin labs and lipo a for  further ris assessmsent    2. Iron deficiency  E61.1 CBC with Differential/Platelet    Comprehensive metabolic panel    Lipid panel    TSH    Hemoglobin A1c    Lipoprotein A (LPA)    IBC + Ferritin   hx of hs of h pylori no sx  anemia resolved    3. Medication management  Z79.899 CBC with Differential/Platelet    Comprehensive metabolic panel    Lipid panel    TSH    Hemoglobin A1c    Lipoprotein A (LPA)    IBC + Ferritin    4. Agatston coronary artery calcium score less than 100  R93.1 CBC with Differential/Platelet    Comprehensive metabolic panel    Lipid panel    TSH    Hemoglobin A1c    Lipoprotein A (LPA)    IBC + Ferritin    5. Thrombocytopenia (HCC)  D69.6 CBC with Differential/Platelet    Comprehensive metabolic panel    Lipid panel    TSH    Hemoglobin A1c    Lipoprotein A (LPA)    IBC + Ferritin    6. Ascending aorta dilatation (HCC)  I77.810 CBC with Differential/Platelet    Comprehensive metabolic panel    Lipid panel    TSH    Hemoglobin A1c    Lipoprotein A (LPA)    IBC + Ferritin    Over all doing well controlled conditions following  Utcd immuniz  Recheck skin if progressing  area seems benign process at present Return in about 1 year (around 05/20/2024) for depending on results.  Patient Care Team:  Teofil Maniaci, Neta Mends, MD as PCP - General (Internal Medicine) Santiago Glad, MD (Neurology) Silverio Lay, MD (Obstetrics and Gynecology) Gastroenterology, [facility] Patient Instructions  Good to see you today . Continue lifestyle intervention healthy eating and exercise .  And monitoring  Lab today  updated.  Exam is normal .  Neta Mends. Aalivia Mcgraw M.D.

## 2023-05-21 NOTE — Patient Instructions (Addendum)
Good to see you today . Continue lifestyle intervention healthy eating and exercise .  And monitoring  Lab today  updated.  Exam is normal .

## 2023-05-22 LAB — IBC + FERRITIN
Ferritin: 39.3 ng/mL (ref 10.0–291.0)
Iron: 91 ug/dL (ref 42–145)
Saturation Ratios: 26.1 % (ref 20.0–50.0)
TIBC: 348.6 ug/dL (ref 250.0–450.0)
Transferrin: 249 mg/dL (ref 212.0–360.0)

## 2023-05-22 LAB — TSH: TSH: 1.84 u[IU]/mL (ref 0.35–5.50)

## 2023-05-23 LAB — LIPOPROTEIN A (LPA): Lipoprotein (a): 36 nmol/L (ref <75)

## 2023-05-27 NOTE — Progress Notes (Signed)
Results in range or normal  lipo a is in favorable  range,  ldl 80  close to goal 70 and below  average,  last  year was 74 .  Can consider  increase to 10 mg crestor but not a strong recommendation  since lipo a is favorable .  Can let me know  if you want to intensify  med dosing

## 2023-08-30 ENCOUNTER — Other Ambulatory Visit: Payer: Self-pay | Admitting: Adult Health

## 2023-09-03 NOTE — Telephone Encounter (Signed)
Rx refilled per last office visit note:  "--Continue Topamax 50 mg daily for prevention"

## 2023-09-06 IMAGING — CT CT CARDIAC CORONARY ARTERY CALCIUM SCORE
3 series · 12 of 20 positions shown, 14 images · non-contrast
Comparison: None.

Addendum:
CLINICAL DATA: Cardiovascular Disease Risk stratification

EXAM:
Coronary Calcium Score
TECHNIQUE: A gated, non-contrast computed tomography scan of the heart was
performed using 3mm slice thickness. Axial images were analyzed on a
dedicated workstation. Calcium scoring of the coronary arteries was
performed using the Agatston method.

[Series 2: cascseq 2.0 sa36 (id) (id) · axial · 0.39mm/px · z∈[-218,-138]mm · 4 of 68 slices shown]
[im 14/68  vessel]
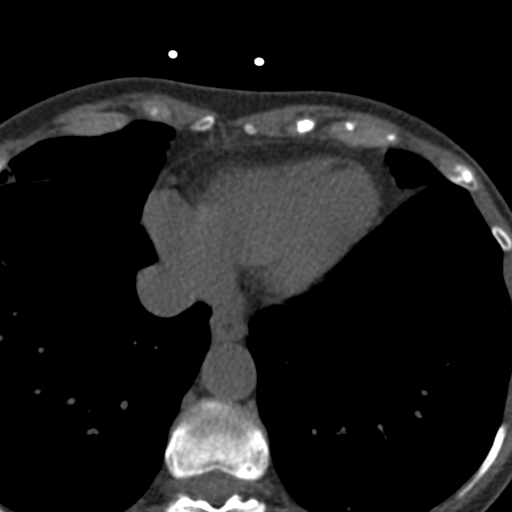
[im 27/68  vessel]
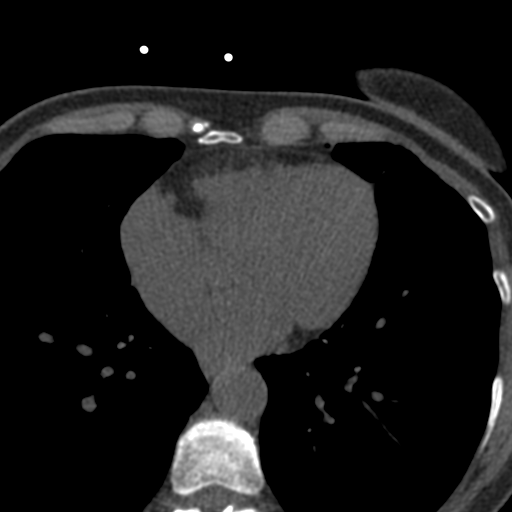
[im 41/68  vessel]
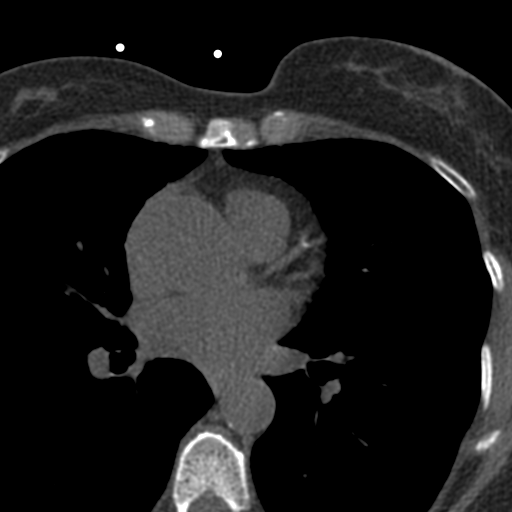
[im 54/68  vessel]
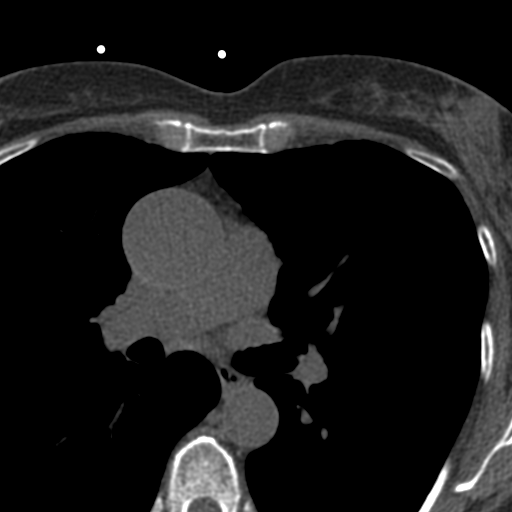

[Series 3: cascseq 2.0 bf37 st · axial · 0.59mm/px · z∈[-222,-134]mm · 5 of 68 slices shown, 7 images]
[im 12/68  vessel]
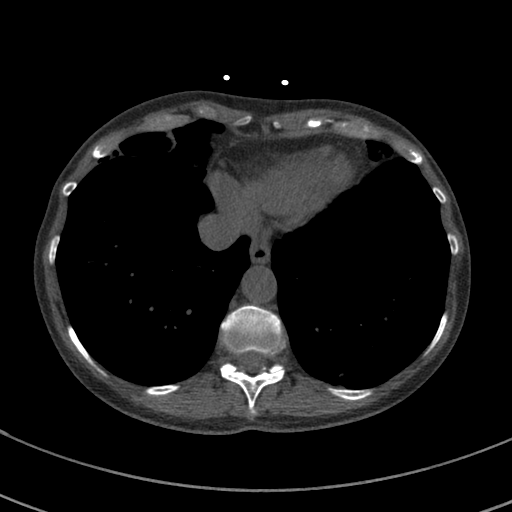
[im 12/68  lung]
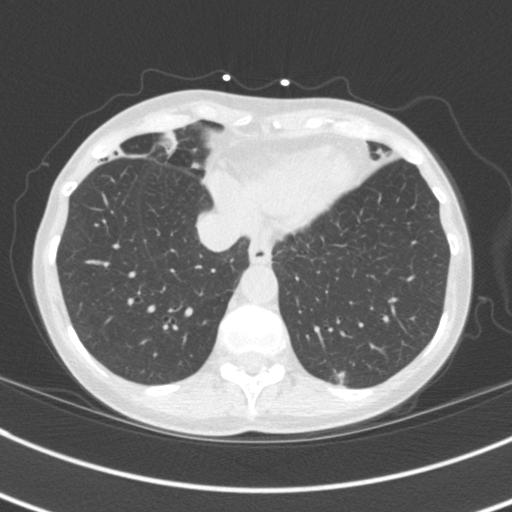
[im 23/68  vessel]
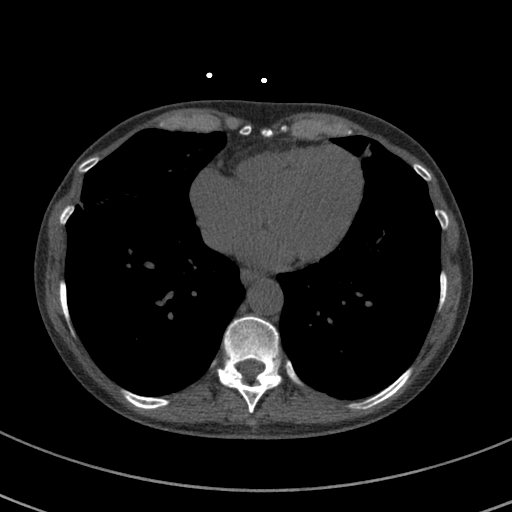
[im 34/68  vessel]
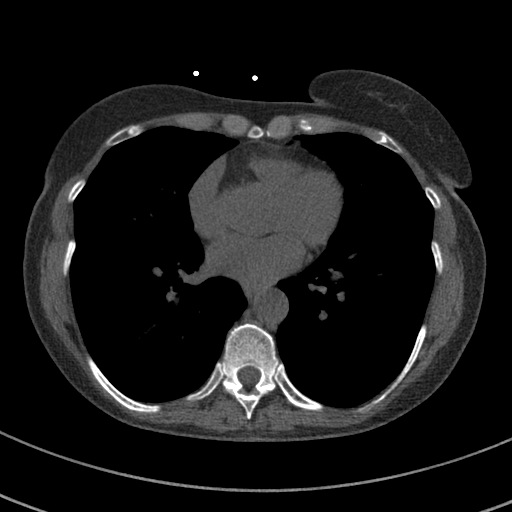
[im 45/68  vessel]
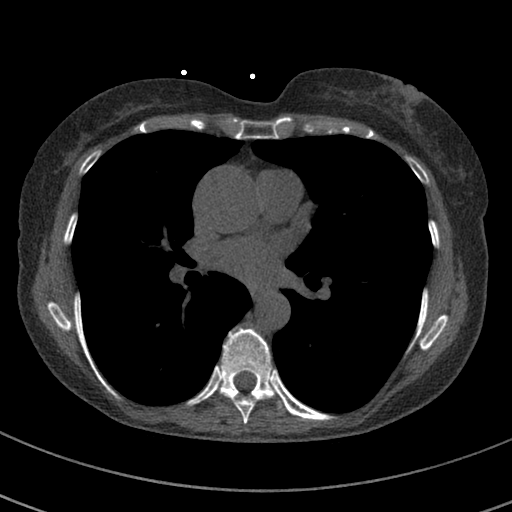
[im 56/68  vessel]
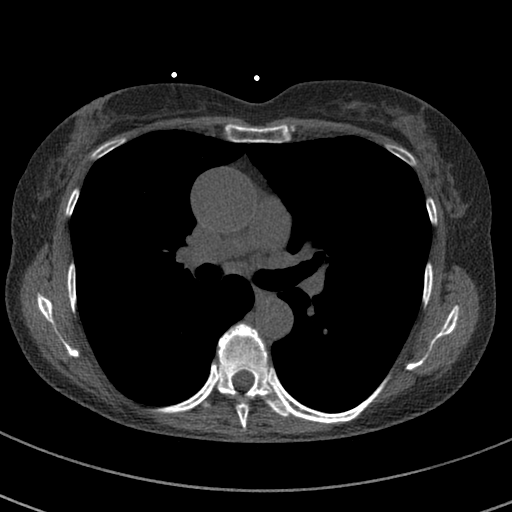
[im 56/68  lung]
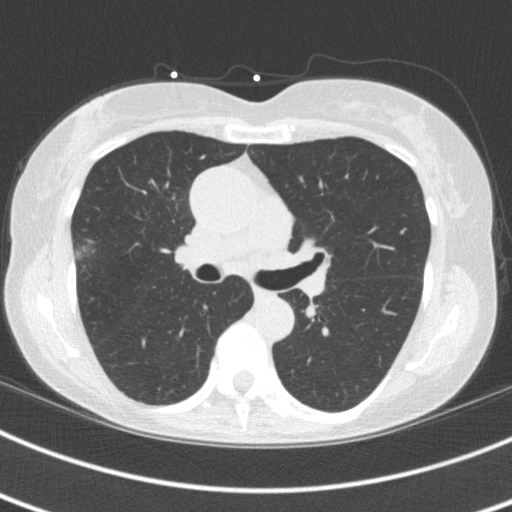

[Series 4: cascseq 2.0 br59 lung · axial · 0.59mm/px · z∈[-222,-178]mm · 3 of 68 slices shown]
[im 12/68  lung]
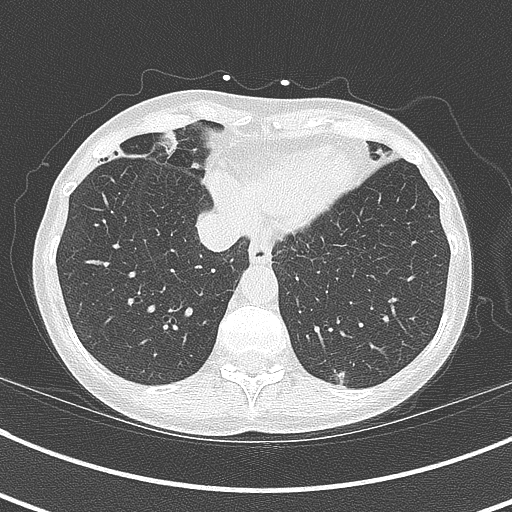
[im 23/68  lung]
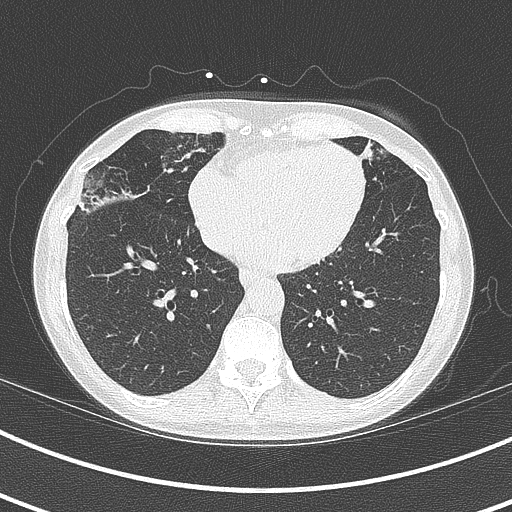
[im 34/68  lung]
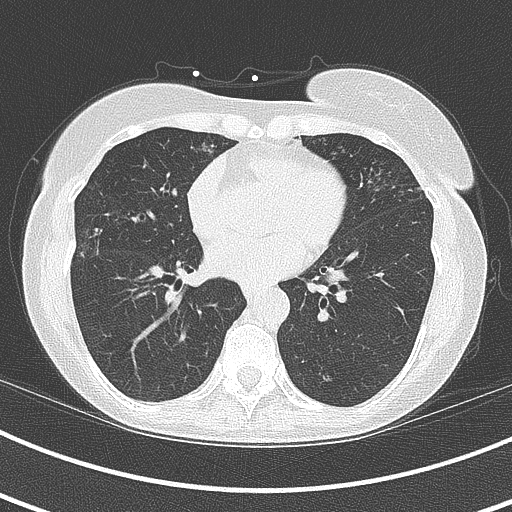

[12 of 20 positions shown; findings below may reference images not displayed]

FINDINGS: Coronary arteries: Normal origins.

Coronary Calcium Score:

Left main: 0

Left anterior descending artery:

Left circumflex artery: 0

Right coronary artery: 0

Total:

Percentile: 42nd

Pericardium: Normal.

Ascending Aorta: Mildly dilated at 39mm. Recommend dedicated gate
Chest CTA for further evaluation.

Non-cardiac: See separate report from [REDACTED].
IMPRESSION: Coronary calcium score of 10.4. This was 42nd percentile for age-,
race-, and sex-matched controls.

Mildly dilated ascending aorta at 39mm. Recommend dedicated gate
Chest CTA for further evaluation.



If CAC=0, it is reasonable to withhold statin therapy and reassess
in 5 to 10 years, as long as higher risk conditions are absent
(diabetes mellitus, family history of premature CHD in first degree
relatives (males <55 years; females <65 years), cigarette smoking,
or LDL >=190 mg/dL).

If CAC is 1 to 99, it is reasonable to initiate statin therapy for
patients >=55 years of age.

If CAC is >=100 or >=75th percentile, it is reasonable to initiate
statin therapy at any age.

Cardiology referral should be considered for patients with CAC
scores >=400 or >=75th percentile.

*0929 AHA/ACC/AACVPR/AAPA/ABC/JULES ERIC/YI-SHIN/FARCAS/Argeni/CEEJAY/DON LOLITO/GIORGI
Guideline on the Management of Blood Cholesterol: A Report of the
American College of Cardiology/American Heart Association Task Force
on Clinical Practice Guidelines. J Am Coll Cardiol.
2145;73(24):5709-5057.

EXAM:
OVER-READ INTERPRETATION  CT CHEST

The following report is an over-read performed by radiologist Dr.
does not include interpretation of cardiac or coronary anatomy or
pathology. The coronary calcium score interpretation by the
cardiologist is attached.
FINDINGS: Cardiovascular: Normal heart size. No significant pericardial
effusion/thickening. Mildly atherosclerotic thoracic aorta with
dilated 4.1 cm ascending thoracic aorta. Normal caliber pulmonary
arteries.

Mediastinum/Nodes: Unremarkable esophagus. No pathologically
enlarged mediastinal or hilar lymph nodes, noting limited
sensitivity for the detection of hilar adenopathy on this
noncontrast study.

Lungs/Pleura: No pneumothorax. No pleural effusion. Widespread
patchy tree-in-bud opacities and nodularity throughout the
visualized lungs bilaterally, most prominent in the right middle
lobe and lingula, with associated mild cylindrical bronchiectasis.
Subpleural triangular foci of consolidation in the peripheral right
middle lobe and lingula at the areas of bronchiectasis.
Representative 0.6 cm posterior left lower lobe nodule (series
4/image 58) and 0.4 cm peripheral right lower lobe nodule (series
4/image 26).

Upper abdomen: No acute abnormality.

Musculoskeletal: No aggressive appearing focal osseous lesions. Mild
thoracic spondylosis.
IMPRESSION: 1. Widespread patchy tree-in-bud opacities and nodularity throughout
the visualized lungs bilaterally, most prominent in the right middle
lobe and lingula, with associated mild cylindrical bronchiectasis.
Subpleural triangular foci of consolidation in the peripheral right
middle lobe and lingula at the areas of bronchiectasis. Findings are
most suggestive of chronic infectious bronchiolitis due to atypical
mycobacterial infection (H ROMEO). Suggest pulmonology consultation and
further evaluation with high-resolution chest CT.
2. Dilated 4.1 cm ascending thoracic aorta. Recommend annual imaging
followup by CTA or MRA. This recommendation follows 8676
ACCF/AHA/AATS/ACR/ASA/SCA/TIGNER/ELIELSON/GULCAN/ENN MARTIN Guidelines for the
Diagnosis and Management of Patients with Thoracic Aortic Disease.
Circulation. 8676; 121: E266-e369. Aortic aneurysm NOS
(237VI-10X.D).
3. Aortic Atherosclerosis (237VI-JZ3.3).

*** End of Addendum ***
FINDINGS: Coronary arteries: Normal origins.

Coronary Calcium Score:

Left main: 0

Left anterior descending artery:

Left circumflex artery: 0

Right coronary artery: 0

Total:

Percentile: 42nd

Pericardium: Normal.

Ascending Aorta: Mildly dilated at 39mm. Recommend dedicated gate
Chest CTA for further evaluation.

Non-cardiac: See separate report from [REDACTED].
IMPRESSION: Coronary calcium score of 10.4. This was 42nd percentile for age-,
race-, and sex-matched controls.

Mildly dilated ascending aorta at 39mm. Recommend dedicated gate
Chest CTA for further evaluation.



If CAC=0, it is reasonable to withhold statin therapy and reassess
in 5 to 10 years, as long as higher risk conditions are absent
(diabetes mellitus, family history of premature CHD in first degree
relatives (males <55 years; females <65 years), cigarette smoking,
or LDL >=190 mg/dL).

If CAC is 1 to 99, it is reasonable to initiate statin therapy for
patients >=55 years of age.

If CAC is >=100 or >=75th percentile, it is reasonable to initiate
statin therapy at any age.

Cardiology referral should be considered for patients with CAC
scores >=400 or >=75th percentile.

*0929 AHA/ACC/AACVPR/AAPA/ABC/JULES ERIC/YI-SHIN/FARCAS/Argeni/CEEJAY/DON LOLITO/GIORGI
Guideline on the Management of Blood Cholesterol: A Report of the
American College of Cardiology/American Heart Association Task Force
on Clinical Practice Guidelines. J Am Coll Cardiol.
2145;73(24):5709-5057.

## 2023-09-30 ENCOUNTER — Other Ambulatory Visit: Payer: Self-pay | Admitting: Obstetrics and Gynecology

## 2023-09-30 DIAGNOSIS — Z1231 Encounter for screening mammogram for malignant neoplasm of breast: Secondary | ICD-10-CM

## 2023-10-17 LAB — HM MAMMOGRAPHY

## 2023-10-23 ENCOUNTER — Encounter: Payer: Self-pay | Admitting: Internal Medicine

## 2023-11-06 ENCOUNTER — Encounter: Payer: Self-pay | Admitting: Adult Health

## 2023-11-07 MED ORDER — FLUOXETINE HCL 40 MG PO CAPS: 40.0000 mg | ORAL_CAPSULE | Freq: Every day | ORAL | 11 refills | Status: DC

## 2023-11-07 NOTE — Addendum Note (Signed)
 Addended by: Enedina Finner on: 11/07/2023 12:43 PM   Modules accepted: Orders

## 2023-11-09 ENCOUNTER — Other Ambulatory Visit: Payer: Self-pay | Admitting: Adult Health

## 2023-11-16 ENCOUNTER — Other Ambulatory Visit: Payer: Self-pay | Admitting: Adult Health

## 2023-11-22 ENCOUNTER — Other Ambulatory Visit: Payer: Self-pay | Admitting: Adult Health

## 2023-12-10 ENCOUNTER — Ambulatory Visit: Payer: Medicare Other | Admitting: Adult Health

## 2024-02-06 ENCOUNTER — Other Ambulatory Visit: Payer: Self-pay | Admitting: Internal Medicine

## 2024-02-16 NOTE — Progress Notes (Unsigned)
 PATIENT: Alicia Keith DOB: 1950-12-12  REASON FOR VISIT: follow up HISTORY FROM: patient  HISTORY OF PRESENT ILLNESS: Today 02/16/24:  Alicia Keith is a 73 y.o. female with a history of Migraine headache. Returns today for follow-up.    Location:  Frequency:  Duration:  Aura:  Photophonia Phonophobia Nausea:  Vomiting:  Numbness:  Weakness:  Visual changes: Gait and balance:  Memory:    Imaging:   Cardiac history:    3/19/24[name] is a 73 y.o. female with a history of migraine headaches and anxiety. Returns today for follow-up.  She remains on Topamax  50 mg daily.  She states that she has a moderate headache most days and only 2-3 severe headaches a month.  For severe headache she will use rizatriptan .  She has tried and failed many medications in the past.  Has tried Botox, Ajovy  and Ubrelvy  in regards to some of the newer medications.  She is never tried [place].   11/29/21: Alicia Keith is a 73 year old female with a history of migraine headaches and anxiety.  She returns today for follow-up.  She is currently on Topamax .  Reports that she was able to decrease her dose to 50 mg daily.  She has approximately 4 headaches a week.  In 2-3 severe headaches a month.  She states for her severe headache she will use rizatriptan  with good benefit.  She reports that reducing her dose of Topamax  did not increase her headache frequency.  She is on Prozac  for her anxiety.  She would like it increased to 20 mg.  States recently she has had more anxiety that has not been controlled.  05/31/21: Alicia Keith is a 73 year old female with a history of migraine headaches and anxiety.  She returns today for follow-up.  She reports that amitriptyline  worked well for her.  She states that she did not have any headaches that she felt the best that she is felt in a long time however after 3 to 4 months she began to have what she describes as heart palpitation.  She  did stop the medication and the symptoms resolved.  However her headaches have returned.  She states that she has almost a mild headache on a daily basis.  She typically will use Advil or baclofen .  For more severe headache she will use the Maxalt .  States that she gets 2-3 intense headaches a month.  Continues on Topamax  50 mg twice a day  11/29/20: Alicia Keith is a 73 year old female with a history of migraine headaches and anxiety.  She returns today for follow-up.  She reports that she continues to have daily headaches.  But she only has 2-3 migraines a month.  She continues on Topamax  50 mg twice a day.  She reports that with severe headache she does take rizatriptan  with good benefit.  The patient has tried multiple medications in the past for her migraines including CGRP agents, Botox and several oral medication.  The patient is currently happy with her migraine management.  At the last visit with Dr. Albertina Hugger she was switched from Zoloft  to Prozac .  Reports that since she started Prozac  she has been getting grinding her teeth at night.  She would like to switch to another medication.  She mentions that she would like to try Celexa .  She also mentions amitriptyline  or nortriptyline  although she has tried this before in the past.  HISTORY 11-04-2019 (Copied from Dr.Dohmeier's note)  Have the pleasure of meeting today with  Alicia Keith minimal 73 year old Caucasian patient with a history of chronic migraines without aura.  She did have status migrainosus many many times in her life but her current level of migraine frequency and intensity has been better controlled.  She has responded well to Ajovy  but then the effect wore off.  In my last visit she started Zoloft  but she has developed a "aura and there is a suspicion that this may be related to the SSRI sertraline .  We are discussing today to change her to Prozac  which should be used at 20 mg daily does not require any kind of washout.   By  now she has failed Botox, all triptans, Ajovy , and only Ubrelvy  has not been tries.      REVIEW OF SYSTEMS: Out of a complete 14 system review of symptoms, the patient complains only of the following symptoms, and all other reviewed systems are negative.  ALLERGIES: Allergies  Allergen Reactions   Amitriptyline      palpitations   Boniva [Ibandronate] Other (See Comments)    Vasculitis    Nortriptyline      Palpitations and racing heart beat   Imipramine Rash    HOME MEDICATIONS: Outpatient Medications Prior to Visit  Medication Sig Dispense Refill   baclofen  (LIORESAL ) 10 MG tablet TAKE 1 TABLET BY MOUTH DAILY AS NEEDED FOR MUSCLE SPASMS. 90 each 1   COVID-19 mRNA bivalent vaccine, Pfizer, (PFIZER COVID-19 VAC BIVALENT) injection Inject into the muscle. 0.3 mL 0   Estradiol 10 MCG TABS vaginal tablet Place vaginally. Using twice weekly     FLUoxetine  (PROZAC ) 40 MG capsule Take 1 capsule (40 mg total) by mouth daily. 30 capsule 11   influenza vaccine adjuvanted (FLUAD QUADRIVALENT ) 0.5 ML injection Inject into the muscle. 0.5 mL 0   rizatriptan  (MAXALT ) 10 MG tablet TAKE 1 TABLET BY MOUTH AS NEEDED FOR MIGRAINE. MAY REPEAT IN 2 HOURS IF NEEDED. MAX 2 TABS IN 24 HOURS 18 tablet 5   rosuvastatin  (CRESTOR ) 5 MG tablet TAKE 1 TABLET BY MOUTH EVERY DAY 90 tablet 0   topiramate  (TOPAMAX ) 50 MG tablet TAKE 1 TABLET(50 MG) BY MOUTH DAILY 90 tablet 1   No facility-administered medications prior to visit.    PAST MEDICAL HISTORY: Past Medical History:  Diagnosis Date   Allergic rhinitis, cause unspecified    Headaches, cluster    History of chicken pox    Migraine headache     PAST SURGICAL HISTORY: Past Surgical History:  Procedure Laterality Date   TONSILLECTOMY  1955    FAMILY HISTORY: Family History  Problem Relation Age of Onset   Hyperlipidemia Mother    Renal cancer Mother    Migraines Mother    Hypertension Father    Stroke Father    Migraines Maternal Aunt     Migraines Maternal Grandmother     SOCIAL HISTORY: Social History   Socioeconomic History   Marital status: Married    Spouse name: Not on file   Number of children: 1   Years of education: 17   Highest education level: Not on file  Occupational History   Occupation: Retired    Comment: PT  Tobacco Use   Smoking status: Never   Smokeless tobacco: Never   Tobacco comments:    married, retired PT  Substance and Sexual Activity   Alcohol use: Yes    Alcohol/week: 2.0 standard drinks of alcohol    Types: 2 Glasses of wine per week    Comment: "Couple glasses of  wine"   Drug use: No   Sexual activity: Not on file  Other Topics Concern   Not on file  Social History Narrative   8 hours of sleep per night   Lives with Dr. 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    Pet cat   Social Drivers of Health   Financial Resource Strain: Low Risk  (01/05/2022)   Overall Financial Resource Strain (CARDIA)    Difficulty of Paying Living Expenses: Not hard at all  Food Insecurity: No Food Insecurity (05/21/2023)   Hunger Vital Sign    Worried About Running Out of Food in the Last Year: Never true    Ran Out of Food in the Last Year: Never true  Transportation Needs: No Transportation Needs (01/04/2021)   PRAPARE - Administrator, Civil Service (Medical): No    Lack of Transportation (Non-Medical): No  Physical Activity: Sufficiently Active (05/21/2023)   Exercise Vital Sign    Days of Exercise per Week: 6 days    Minutes of Exercise per Session: 40 min  Stress: No Stress Concern Present (05/21/2023)   [employer] of Occupational Health - Occupational Stress Questionnaire    Feeling of Stress : Not at all  Social Connections: Moderately Isolated (05/21/2023)   Social Connection and Isolation Panel [NHANES]    Frequency of Communication with Friends and Family: More than three times a week    Frequency of Social Gatherings with Friends and Family: Three times a week     Attends Religious Services: Never    Active Member of Clubs or Organizations: No    Attends Banker Meetings: Never    Marital Status: Married  Catering manager Violence: Not At Risk (05/21/2023)   Humiliation, Afraid, Rape, and Kick questionnaire    Fear of Current or Ex-Partner: No    Emotionally Abused: No    Physically Abused: No    Sexually Abused: No      PHYSICAL EXAM  There were no vitals filed for this visit.  There is no height or weight on file to calculate BMI.  Generalized: Well developed, in no acute distress   Neurological examination  Mentation: Alert oriented to time, place, history taking. Follows all commands speech and language fluent Cranial nerve II-XII: Pupils were equal round reactive to light. Extraocular movements were full, visual field were full on confrontational test. Head turning and shoulder shrug  were normal and symmetric. Motor: The motor testing reveals 5 over 5 strength of all 4 extremities. Good symmetric motor tone is noted throughout.  Sensory: Sensory testing is intact to soft touch on all 4 extremities. No evidence of extinction is noted.  Coordination: Cerebellar testing reveals good finger-nose-finger and heel-to-shin bilaterally.  Gait and station: Gait is normal.   DIAGNOSTIC DATA (LABS, IMAGING, TESTING) - I reviewed patient records, labs, notes, testing and imaging myself where available.  Lab Results  Component Value Date   WBC 6.2 05/21/2023   HGB 12.1 05/21/2023   HCT 37.6 05/21/2023   MCV 86.8 05/21/2023   PLT 163.0 05/21/2023      Component Value Date/Time   NA 136 05/21/2023 1024   NA 142 03/12/2017 1346   K 4.0 05/21/2023 1024   CL 101 05/21/2023 1024   CO2 27 05/21/2023 1024   GLUCOSE 86 05/21/2023 1024   BUN 28 (H) 05/21/2023 1024   BUN 20 03/12/2017 1346   CREATININE 0.93 05/21/2023 1024   CALCIUM  9.3 05/21/2023 1024  PROT 7.2 05/21/2023 1024   PROT 6.7 03/12/2017 1346   ALBUMIN 4.1  05/21/2023 1024   ALBUMIN 4.2 03/12/2017 1346   AST 20 05/21/2023 1024   ALT 17 05/21/2023 1024   ALKPHOS 60 05/21/2023 1024   BILITOT 0.4 05/21/2023 1024   BILITOT <0.2 03/12/2017 1346   GFRNONAA 68 03/12/2017 1346   GFRAA 79 03/12/2017 1346   Lab Results  Component Value Date   CHOL 172 05/21/2023   HDL 74.30 05/21/2023   LDLCALC 80 05/21/2023   TRIG 88.0 05/21/2023   CHOLHDL 2 05/21/2023    Lab Results  Component Value Date   TSH 1.84 05/21/2023      ASSESSMENT AND PLAN 73 y.o. year old female  has a past medical history of Allergic rhinitis, cause unspecified, Headaches, cluster, History of chicken pox, and Migraine headache. here with;  1.  Migraine headaches  --Continue Topamax  50 mg daily for prevention --Continue rizatriptan  for abortive therapy -- Continue baclofen  as needed for abortive therapy -- Consider Qulipta 30 mg daily for prevention will call and let us  know if she would like to try this  2.  Anxiety  -- Continue Prozac  to 20 mg daily  Follow-up in 1 year or sooner if needed       Clem Currier, MSN, NP-C 02/16/2024, 1:39 PM [facility] [address], [address] [place], [place] [address] [phone]

## 2024-02-17 ENCOUNTER — Encounter: Payer: Self-pay | Admitting: Adult Health

## 2024-02-17 ENCOUNTER — Ambulatory Visit (INDEPENDENT_AMBULATORY_CARE_PROVIDER_SITE_OTHER): Admitting: Adult Health

## 2024-02-17 VITALS — BP 102/63 | HR 84 | Ht 60.0 in | Wt 126.0 lb

## 2024-02-17 DIAGNOSIS — G43719 Chronic migraine without aura, intractable, without status migrainosus: Secondary | ICD-10-CM | POA: Diagnosis not present

## 2024-02-17 DIAGNOSIS — G43011 Migraine without aura, intractable, with status migrainosus: Secondary | ICD-10-CM

## 2024-02-17 DIAGNOSIS — F419 Anxiety disorder, unspecified: Secondary | ICD-10-CM | POA: Diagnosis not present

## 2024-02-17 MED ORDER — SERTRALINE HCL 25 MG PO TABS
25.0000 mg | ORAL_TABLET | Freq: Every day | ORAL | 11 refills | Status: AC
Start: 2024-02-17 — End: ?

## 2024-02-17 NOTE — Patient Instructions (Addendum)
 Your Plan:  Continue Topamax  Apply for patient assistance for Qulipta if you qualify I will place an order for the medication Discussed with Dr. Albertina Hugger and she is okay with you switching to Zoloft .  Stop Prozac .  After 2 days you can start Zoloft  25 mg at bedtime.  Thank you for coming to see us  at [facility]. I hope we have been able to provide you high quality care today.  You may receive a patient satisfaction survey over the next few weeks. We would appreciate your feedback and comments so that we may continue to improve ourselves and the health of our patients.

## 2024-02-19 NOTE — Progress Notes (Signed)
 Patient with longstanding and severe migraine, here for routine Rv with NP. I agree with assessment and plan, .Neomia Banner, MD

## 2024-03-17 ENCOUNTER — Ambulatory Visit (INDEPENDENT_AMBULATORY_CARE_PROVIDER_SITE_OTHER): Admitting: Family Medicine

## 2024-03-17 DIAGNOSIS — Z0000 Encounter for general adult medical examination without abnormal findings: Secondary | ICD-10-CM

## 2024-03-17 NOTE — Progress Notes (Signed)
 PATIENT CHECK-IN and HEALTH RISK ASSESSMENT QUESTIONNAIRE:  -completed by phone/video for upcoming Medicare Preventive Visit   Pre-Visit Check-in: 1)Vitals (height, wt, BP, etc) - record in vitals section for visit on day of visit Request home vitals (wt, BP, etc.) and enter into vitals, THEN update Vital Signs SmartPhrase below at the top of the HPI. See below.  2)Review and Update Medications, Allergies PMH, Surgeries, Social history in Epic 3)Hospitalizations in the last year with date/reason? n  4)Review and Update Care Team (patient's specialists) in Epic 5) Complete PHQ9 in Epic  6) Complete Fall Screening in Epic 7)Review all Health Maintenance Due and order under PCP if not done.  Medicare Wellness Patient Questionnaire:  Answer theses question about your habits: How often do you have a drink containing alcohol? Couple days week  How many drinks containing alcohol do you have on a typical day when you are drinking?1 drinks max How often do you have six or more drinks on one occasion? never Have you ever smoked?n Quit date if applicable? na  How many packs a day do/did you smoke? na Do you use smokeless tobacco?n Do you use an illicit drugs?n On average, how many days per week do you engage in moderate to strenuous exercise (like a brisk walk)?daily, walking, weights, cycling On average, how many minutes do you engage in exercise at this level?30 Typical breakfast/lunch: whole foods diet Typical dinner: cooks at home Typical snacks:nuts, fruit  Beverages: water  Answer theses question about your everyday activities: Can you perform most household chores?y Are you deaf or have significant trouble hearing?n Do you feel that you have a problem with memory?n Do you feel safe at home?y Last dentist visit?y 8. Do you have any difficulty performing your everyday activities?n Are you having any difficulty walking, taking medications on your own, and or difficulty managing daily  home needs?n Do you have difficulty walking or climbing stairs?n Do you have difficulty dressing or bathing?n Do you have difficulty doing errands alone such as visiting a doctor's office or shopping?n Do you currently have any difficulty preparing food and eating?n Do you currently have any difficulty using the toilet?n Do you have any difficulty managing your finances?n Do you have any difficulties with housekeeping of managing your housekeeping?n   Do you have Advanced Directives in place (Living Will, Healthcare Power or Attorney)? y   Last eye Exam and location?[facility]   Do you currently use prescribed or non-prescribed narcotic or opioid pain medications?n  Do you have a history or close family history of breast, ovarian, tubal or peritoneal cancer or a family member with BRCA (breast cancer susceptibility 1 and 2) gene mutations? none     ----------------------------------------------------------------------------------------------------------------------------------------------------------------------------------------------------------------------  Because this visit was a virtual/telehealth visit, some criteria may be missing or patient reported. Any vitals not documented were not able to be obtained and vitals that have been documented are patient reported.    MEDICARE ANNUAL PREVENTIVE VISIT WITH PROVIDER: (Welcome to Medicare, initial annual wellness or annual wellness exam)  Virtual Visit via VideoNote  I connected with Alicia Keith on 03/17/24 by a video enabled telemedicine application and verified that I am speaking with the correct person using two identifiers.  Location patient: home Location provider:work or home office Persons participating in the virtual visit: patient, provider  Concerns and/or follow up today: had cataract surgery recently, doing ok.    See HM section in Epic for other details of completed HM.    ROS: negative for  report of  fevers, unintentional weight loss, vision changes, vision loss, hearing loss or change, chest pain, sob, hemoptysis, melena, hematochezia, hematuria, falls, bleeding or bruising, thoughts of suicide or self harm, memory loss  Patient-completed extensive health risk assessment - reviewed and discussed with the patient: See Health Risk Assessment completed with patient prior to the visit either above or in recent phone note. This was reviewed in detailed with the patient today and appropriate recommendations, orders and referrals were placed as needed per Summary below and patient instructions.   Review of Medical History: -PMH, PSH, Family History and current specialty and care providers reviewed and updated and listed below   Patient Care Team: Panosh, Apolinar POUR, MD as PCP - General (Internal Medicine) Oneita Na, MD (Neurology) Darcel Pool, MD (Obstetrics and Gynecology) Gastroenterology, Margarete   Past Medical History:  Diagnosis Date   Allergic rhinitis, cause unspecified    Headaches, cluster    History of chicken pox    Migraine headache     Past Surgical History:  Procedure Laterality Date   TONSILLECTOMY  1955    Social History   Socioeconomic History   Marital status: Married    Spouse name: Not on file   Number of children: 1   Years of education: 17   Highest education level: Bachelor's degree (e.g., BA, AB, BS)  Occupational History   Occupation: Retired    Comment: PT  Tobacco Use   Smoking status: Never   Smokeless tobacco: Never   Tobacco comments:    married, retired PT  Advertising account planner   Vaping status: Not on file  Substance and Sexual Activity   Alcohol use: Yes    Alcohol/week: 2.0 standard drinks of alcohol    Types: 2 Glasses of wine per week    Comment: Couple glasses of wine   Drug use: No   Sexual activity: Not on file  Other Topics Concern   Not on file  Social History Narrative   8 hours of sleep per night   Lives with Dr.  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    Pet cat   Social Drivers of Health   Financial Resource Strain: Low Risk  (03/16/2024)   Overall Financial Resource Strain (CARDIA)    Difficulty of Paying Living Expenses: Not hard at all  Food Insecurity: No Food Insecurity (03/16/2024)   Hunger Vital Sign    Worried About Running Out of Food in the Last Year: Never true    Ran Out of Food in the Last Year: Never true  Transportation Needs: No Transportation Needs (03/16/2024)   PRAPARE - Administrator, Civil Service (Medical): No    Lack of Transportation (Non-Medical): No  Physical Activity: Sufficiently Active (03/16/2024)   Exercise Vital Sign    Days of Exercise per Week: 6 days    Minutes of Exercise per Session: 30 min  Stress: No Stress Concern Present (03/16/2024)   Alicia Keith [employer] Occupational [employer] - Occupational Stress Questionnaire    Feeling of Stress: Not at all  Social Connections: Moderately Isolated (03/16/2024)   Social Connection and Isolation Panel    Frequency of Communication with Friends and Family: More than three times a week    Frequency of Social Gatherings with Friends and Family: Once a week    Attends Religious Services: Never    Database administrator or Organizations: No    Attends Banker Meetings: Not on file    Marital  Status: Married  Catering manager Violence: Not At Risk (05/21/2023)   Humiliation, Afraid, Rape, and Kick questionnaire    Fear of Current or Ex-Partner: No    Emotionally Abused: No    Physically Abused: No    Sexually Abused: No    Family History  Problem Relation Age of Onset   Hyperlipidemia Mother    Renal cancer Mother    Migraines Mother    Hypertension Father    Stroke Father    Migraines Maternal Aunt    Migraines Maternal Grandmother     Current Outpatient Medications on File Prior to Visit  Medication Sig Dispense Refill   baclofen  (LIORESAL ) 10 MG tablet TAKE 1 TABLET BY  MOUTH DAILY AS NEEDED FOR MUSCLE SPASMS. 90 each 1   COVID-19 mRNA bivalent vaccine, Pfizer, (PFIZER COVID-19 VAC BIVALENT) injection Inject into the muscle. 0.3 mL 0   Estradiol 10 MCG TABS vaginal tablet Place vaginally 3 (three) times a week. Using twice weekly     influenza vaccine adjuvanted (FLUAD  QUADRIVALENT) 0.5 ML injection Inject into the muscle. 0.5 mL 0   rizatriptan  (MAXALT ) 10 MG tablet TAKE 1 TABLET BY MOUTH AS NEEDED FOR MIGRAINE. MAY REPEAT IN 2 HOURS IF NEEDED. MAX 2 TABS IN 24 HOURS 18 tablet 5   rosuvastatin  (CRESTOR ) 5 MG tablet TAKE 1 TABLET BY MOUTH EVERY DAY 90 tablet 0   sertraline  (ZOLOFT ) 25 MG tablet Take 1 tablet (25 mg total) by mouth daily. 30 tablet 11   topiramate  (TOPAMAX ) 50 MG tablet TAKE 1 TABLET(50 MG) BY MOUTH DAILY 90 tablet 1   No current facility-administered medications on file prior to visit.    Allergies  Allergen Reactions   Amitriptyline      palpitations   Boniva [Ibandronate] Other (See Comments)    Vasculitis    Nortriptyline      Palpitations and racing heart beat   Imipramine Rash       Physical Exam Vitals requested from patient and listed below if patient had equipment and was able to obtain at home for this virtual visit: There were no vitals filed for this visit. Estimated body mass index is 24.61 kg/m as calculated from the following:   Height as of 02/17/24: 5' (1.524 m).   Weight as of 02/17/24: 126 lb (57.2 kg).  EKG (optional): deferred due to virtual visit  GENERAL: alert, oriented, no acute distress detected, full vision exam deferred due to pandemic and/or virtual encounter   HEENT: atraumatic, conjunttiva clear, no obvious abnormalities on inspection of external nose and ears  NECK: normal movements of the head and neck  LUNGS: on inspection no signs of respiratory distress, breathing rate appears normal, no obvious gross SOB, gasping or wheezing  CV: no obvious cyanosis  MS: moves all visible extremities  without noticeable abnormality  PSYCH/NEURO: pleasant and cooperative, no obvious depression or anxiety, speech and thought processing grossly intact, Cognitive function grossly intact  Flowsheet Row Office Visit from [facility]/17/2022 in [facility]  PHQ-9 Total Score 1        03/17/2024    4:44 PM 05/21/2023    9:56 AM 01/08/2023   11:38 AM 01/17/2022   11:52 AM 01/05/2022   11:15 AM  Depression screen PHQ 2/9  Decreased Interest 0 0 0 0 0  Down, Depressed, Hopeless 0 0 0 0 0  PHQ - 2 Score 0 0 0 0 0  Altered sleeping    0   Tired, decreased energy  1   Change in appetite    0   Feeling bad or failure about yourself     0   Trouble concentrating    0   Moving slowly or fidgety/restless    0   Suicidal thoughts    0   PHQ-9 Score    1   Difficult doing work/chores    Not difficult at all        01/05/2022   11:17 AM 01/17/2022   11:52 AM 01/08/2023   11:38 AM 05/21/2023    9:56 AM 03/17/2024    4:44 PM  Fall Risk  Falls in the past year? 0 0  0 0  Was there an injury with Fall? 0 0 0 0 0  Fall Risk Category Calculator 0 0  0 0  Fall Risk Category (Retired) Low  Low      (RETIRED) Patient Fall Risk Level Low fall risk  Low fall risk      Patient at Risk for Falls Due to No Fall Risks No Fall Risks No Fall Risks No Fall Risks   Fall risk Follow up  Falls evaluation completed  Falls evaluation completed Falls evaluation completed Falls evaluation completed     Data saved with a previous flowsheet row definition     SUMMARY AND PLAN:  Encounter for Medicare annual wellness exam   Discussed applicable health maintenance/preventive health measures and advised and referred or ordered per patient preferences: -advised on recs for covid vaccine and can get at the pharmacy Health Maintenance  Topic Date Due   COVID-19 Vaccine (7 - Pfizer risk 2024-25 season) 01/02/2024   INFLUENZA VACCINE  04/17/2024   MAMMOGRAM  10/16/2024   Medicare Annual Wellness  (AWV)  03/17/2025   DTaP/Tdap/Td (3 - Td or Tdap) 03/26/2028   Colonoscopy  12/22/2031   Pneumococcal Vaccine: 50+ Years  Completed   DEXA SCAN  Completed   Hepatitis C Screening  Completed   Zoster Vaccines- Shingrix   Completed   Hepatitis B Vaccines  Aged Out   HPV VACCINES  Aged Out   Meningococcal B Vaccine  Aged Out      Education and counseling on the following was provided based on the above review of health and a plan/checklist for the patient, along with additional information discussed, was provided for the patient in the patient instructions :  -Advised and counseled on a healthy lifestyle  -Reviewed patient's current diet. A summary of a healthy diet was provided in the Patient Instructions.  -reviewed patient's current physical activity level and discussed exercise guidelines for adults.congratulated on current habits and provided further information in patient instructions.  -Advise yearly dental visits at minimum and regular eye exams   Follow up: see patient instructions     Patient Instructions  I really enjoyed getting to talk with you today! I am available on Tuesdays and Thursdays for virtual visits if you have any questions or concerns, or if I can be of any further assistance.   CHECKLIST FROM ANNUAL WELLNESS VISIT:  -Follow up (please call to schedule if not scheduled after visit):   -yearly for annual wellness visit with primary care office  Here is a list of your preventive care/health maintenance measures and the plan for each if any are due:  PLAN For any measures below that may be due:     1. Can get the covid vaccine at the pharmacy, please let us  know if you do so that we can update your  record.   Health Maintenance  Topic Date Due   COVID-19 Vaccine (7 - Pfizer risk 2024-25 season) 01/02/2024   INFLUENZA VACCINE  04/17/2024   MAMMOGRAM  10/16/2024   Medicare Annual Wellness (AWV)  03/17/2025   DTaP/Tdap/Td (3 - Td or Tdap) 03/26/2028    Colonoscopy  12/22/2031   Pneumococcal Vaccine: 50+ Years  Completed   DEXA SCAN  Completed   Hepatitis C Screening  Completed   Zoster Vaccines- Shingrix   Completed   Hepatitis B Vaccines  Aged Out   HPV VACCINES  Aged Out   Meningococcal B Vaccine  Aged Out    -See a dentist at least yearly  -Get your eyes checked and then per your eye specialist's recommendations  -Other issues addressed today:   -I have included below further information regarding a healthy whole foods based diet, physical activity guidelines for adults, stress management and opportunities for social connections. I hope you find this information useful.   -----------------------------------------------------------------------------------------------------------------------------------------------------------------------------------------------------------------------------------------------------------    NUTRITION: -eat real food: lots of colorful vegetables (half the plate) and fruits -5-7 servings of vegetables and fruits per day (fresh or steamed is best), exp. 2 servings of vegetables with lunch and dinner and 2 servings of fruit per day. Berries and greens such as kale and collards are great choices.  -consume on a regular basis:  fresh fruits, fresh veggies, fish, nuts, seeds, healthy oils (such as olive oil, avocado oil), whole grains (make sure for bread/pasta/crackers/etc., that the first ingredient on label contains the word whole), legumes. -can eat small amounts of dairy and lean meat (no larger than the palm of your hand), but avoid processed meats such as ham, bacon, lunch meat, etc. -drink water -try to avoid fast food and pre-packaged foods, processed meat, ultra processed foods/beverages (donuts, candy, etc.) -most experts advise limiting sodium to < 2300mg  per day, should limit further is any chronic conditions such as high blood pressure, heart disease, diabetes, etc. The American Heart  Association advised that < 1500mg  is is ideal -try to avoid foods/beverages that contain any ingredients with names you do not recognize  -try to avoid foods/beverages  with added sugar or sweeteners/sweets  -try to avoid sweet drinks (including diet drinks): soda, juice, Gatorade, sweet tea, power drinks, diet drinks -try to avoid white rice, white bread, pasta (unless whole grain)  EXERCISE GUIDELINES FOR ADULTS: -if you wish to increase your physical activity, do so gradually and with the approval of your doctor -STOP and seek medical care immediately if you have any chest pain, chest discomfort or trouble breathing when starting or increasing exercise  -move and stretch your body, legs, feet and arms when sitting for long periods -Physical activity guidelines for optimal health in adults: -get at least 150 minutes per week of moderate exercise (can talk, but not sing); this is about 20-30 minutes of sustained activity 5-7 days per week or two 10-15 minute episodes of sustained activity 5-7 days per week -do some muscle building/resistance training/strength training at least 2 days per week  -balance exercises 3+ days per week:   Stand somewhere where you have something sturdy to hold onto if you lose balance    1) lift up on toes, then back down, start with 5x per day and work up to 20x   2) stand and lift one leg straight out to the side so that foot is a few inches of the floor, start with 5x each side and work up to 20x each side  3) stand on one foot, start with 5 seconds each side and work up to 20 seconds on each side  If you need ideas or help with getting more active:  -Silver sneakers https://tools.silversneakers.com  -Walk with a Doc: http://www.duncan-williams.com/  -try to include resistance (weight lifting/strength building) and balance exercises twice per week: or the following link for  ideas: http://castillo-powell.com/  BuyDucts.dk  STRESS MANAGEMENT: -can try meditating, or just sitting quietly with deep breathing while intentionally relaxing all parts of your body for 5 minutes daily -if you need further help with stress, anxiety or depression please follow up with your primary doctor or contact the wonderful folks at [facility]: [phone]  SOCIAL CONNECTIONS: -options in [place] if you wish to engage in more social and exercise related activities:  -Silver sneakers https://tools.silversneakers.com  -Walk with a Doc: http://www.duncan-williams.com/  -Check out the [facility] Active Adults 50+ section on the [place] [employer] (hiking clubs, book clubs, cards and games, chess, exercise classes, aquatic classes and much more) - see the website for details: https://www.[place].gov/departments/parks-recreation/active-adults50  -YouTube has lots of exercise videos for different ages and abilities as well  -[facility] (a variety of indoor and outdoor inperson activities for adults). [phone]. [address]  -Virtual Online Classes (a variety of topics): see seniorplanet.org or call [phone]) [phone]  -consider volunteering at a school, hospice center, church, senior center or elsewhere            Chiquita JONELLE Cramp, DO

## 2024-03-17 NOTE — Patient Instructions (Signed)
 I really enjoyed getting to talk with you today! I am available on Tuesdays and Thursdays for virtual visits if you have any questions or concerns, or if I can be of any further assistance.   CHECKLIST FROM ANNUAL WELLNESS VISIT:  -Follow up (please call to schedule if not scheduled after visit):   -yearly for annual wellness visit with primary care office  Here is a list of your preventive care/health maintenance measures and the plan for each if any are due:  PLAN For any measures below that may be due:     1. Can get the covid vaccine at the pharmacy, please let us  know if you do so that we can update your record.   Health Maintenance  Topic Date Due   COVID-19 Vaccine (7 - Pfizer risk 2024-25 season) 01/02/2024   INFLUENZA VACCINE  04/17/2024   MAMMOGRAM  10/16/2024   Medicare Annual Wellness (AWV)  03/17/2025   DTaP/Tdap/Td (3 - Td or Tdap) 03/26/2028   Colonoscopy  12/22/2031   Pneumococcal Vaccine: 50+ Years  Completed   DEXA SCAN  Completed   Hepatitis C Screening  Completed   Zoster Vaccines- Shingrix   Completed   Hepatitis B Vaccines  Aged Out   HPV VACCINES  Aged Out   Meningococcal B Vaccine  Aged Out    -See a dentist at least yearly  -Get your eyes checked and then per your eye specialist's recommendations  -Other issues addressed today:   -I have included below further information regarding a healthy whole foods based diet, physical activity guidelines for adults, stress management and opportunities for social connections. I hope you find this information useful.   -----------------------------------------------------------------------------------------------------------------------------------------------------------------------------------------------------------------------------------------------------------    NUTRITION: -eat real food: lots of colorful vegetables (half the plate) and fruits -5-7 servings of vegetables and fruits per day (fresh or  steamed is best), exp. 2 servings of vegetables with lunch and dinner and 2 servings of fruit per day. Berries and greens such as kale and collards are great choices.  -consume on a regular basis:  fresh fruits, fresh veggies, fish, nuts, seeds, healthy oils (such as olive oil, avocado oil), whole grains (make sure for bread/pasta/crackers/etc., that the first ingredient on label contains the word whole), legumes. -can eat small amounts of dairy and lean meat (no larger than the palm of your hand), but avoid processed meats such as ham, bacon, lunch meat, etc. -drink water -try to avoid fast food and pre-packaged foods, processed meat, ultra processed foods/beverages (donuts, candy, etc.) -most experts advise limiting sodium to < 2300mg  per day, should limit further is any chronic conditions such as high blood pressure, heart disease, diabetes, etc. The American Heart Association advised that < 1500mg  is is ideal -try to avoid foods/beverages that contain any ingredients with names you do not recognize  -try to avoid foods/beverages  with added sugar or sweeteners/sweets  -try to avoid sweet drinks (including diet drinks): soda, juice, Gatorade, sweet tea, power drinks, diet drinks -try to avoid white rice, white bread, pasta (unless whole grain)  EXERCISE GUIDELINES FOR ADULTS: -if you wish to increase your physical activity, do so gradually and with the approval of your doctor -STOP and seek medical care immediately if you have any chest pain, chest discomfort or trouble breathing when starting or increasing exercise  -move and stretch your body, legs, feet and arms when sitting for long periods -Physical activity guidelines for optimal health in adults: -get at least 150 minutes per week of moderate exercise (can  talk, but not sing); this is about 20-30 minutes of sustained activity 5-7 days per week or two 10-15 minute episodes of sustained activity 5-7 days per week -do some muscle  building/resistance training/strength training at least 2 days per week  -balance exercises 3+ days per week:   Stand somewhere where you have something sturdy to hold onto if you lose balance    1) lift up on toes, then back down, start with 5x per day and work up to 20x   2) stand and lift one leg straight out to the side so that foot is a few inches of the floor, start with 5x each side and work up to 20x each side   3) stand on one foot, start with 5 seconds each side and work up to 20 seconds on each side  If you need ideas or help with getting more active:  -Silver sneakers https://tools.silversneakers.com  -Walk with a Doc: http://www.duncan-williams.com/  -try to include resistance (weight lifting/strength building) and balance exercises twice per week: or the following link for ideas: http://castillo-powell.com/  BuyDucts.dk  STRESS MANAGEMENT: -can try meditating, or just sitting quietly with deep breathing while intentionally relaxing all parts of your body for 5 minutes daily -if you need further help with stress, anxiety or depression please follow up with your primary doctor or contact the wonderful folks at [facility]: [phone]  SOCIAL CONNECTIONS: -options in [place] if you wish to engage in more social and exercise related activities:  -Silver sneakers https://tools.silversneakers.com  -Walk with a Doc: http://www.duncan-williams.com/  -Check out the [facility] Active Adults 50+ section on the [place] [employer] (hiking clubs, book clubs, cards and games, chess, exercise classes, aquatic classes and much more) - see the website for details: https://www.[place].gov/departments/parks-recreation/active-adults50  -YouTube has lots of exercise videos for different ages and abilities as well  -[facility] (a variety of indoor and outdoor  inperson activities for adults). [phone]. [address].  -Virtual Online Classes (a variety of topics): see seniorplanet.org or call [phone]  -consider volunteering at a school, hospice center, church, senior center or elsewhere

## 2024-05-04 ENCOUNTER — Ambulatory Visit: Payer: Medicare Other | Admitting: Adult Health

## 2024-05-10 ENCOUNTER — Other Ambulatory Visit: Payer: Self-pay | Admitting: Internal Medicine

## 2024-05-12 ENCOUNTER — Other Ambulatory Visit: Payer: Self-pay | Admitting: Adult Health

## 2024-08-18 ENCOUNTER — Other Ambulatory Visit: Payer: Self-pay | Admitting: Internal Medicine

## 2024-08-18 ENCOUNTER — Encounter: Payer: Self-pay | Admitting: Internal Medicine

## 2024-08-18 MED ORDER — ROSUVASTATIN CALCIUM 5 MG PO TABS: 5.0000 mg | ORAL_TABLET | Freq: Every day | ORAL | 0 refills | Status: DC

## 2024-08-24 ENCOUNTER — Encounter: Payer: Self-pay | Admitting: Neurology

## 2024-08-24 ENCOUNTER — Ambulatory Visit: Admitting: Neurology

## 2024-08-24 VITALS — BP 108/82 | HR 81 | Ht 60.0 in | Wt 127.0 lb

## 2024-08-24 DIAGNOSIS — I77810 Thoracic aortic ectasia: Secondary | ICD-10-CM

## 2024-08-24 DIAGNOSIS — D696 Thrombocytopenia, unspecified: Secondary | ICD-10-CM | POA: Diagnosis not present

## 2024-08-24 DIAGNOSIS — G43011 Migraine without aura, intractable, with status migrainosus: Secondary | ICD-10-CM | POA: Diagnosis not present

## 2024-08-24 DIAGNOSIS — G44019 Episodic cluster headache, not intractable: Secondary | ICD-10-CM | POA: Diagnosis not present

## 2024-08-24 DIAGNOSIS — G43001 Migraine without aura, not intractable, with status migrainosus: Secondary | ICD-10-CM

## 2024-08-24 MED ORDER — NURTEC 75 MG PO TBDP: ORAL_TABLET | ORAL | 5 refills | Status: AC

## 2024-08-24 MED ORDER — RIZATRIPTAN BENZOATE 10 MG PO TABS: ORAL_TABLET | ORAL | 5 refills | Status: AC

## 2024-08-24 MED ORDER — TOPIRAMATE 50 MG PO TABS: 50.0000 mg | ORAL_TABLET | Freq: Two times a day (BID) | ORAL | 1 refills | Status: AC

## 2024-08-24 NOTE — Patient Instructions (Signed)
 Yearly RV ;  In regards to her headaches:  Miserable :daily headaches, between 6 and 7/ 10 intensity, helps to lay down and put a ice pack on her forehead  If it reaches 10/ 10 , she places an ice pack in the neck.   Abortive ; triptans.    No other , secondary headache types.    Location: Frontal and occiptal  Frequency: daily for April and May and  October/November ,  Duration: can last hours , if not using a triptan  ( that works within one hour)  Aura: no but may get a flash of light with migraine   Photophonia: yes Phonophobia: yes Olfactophobia : yes Nausea: Yes Vomiting: no Numbness: no  Weakness: no  Visual changes:flashing light during headache on rare occasion  Gait and balance: no, occassional dizziness Memory: no- just foggy feeling while having headache with a migraine- character.     Abortive: Advil/baclofen  first  then will take rizitriptan    Imaging: 06/26/16:  IMPRESSION: No acute intracranial abnormality is identified. Unremarkable MRI of the brain for age.   Cardiac history: None         Fam Hx : see previous note  Social HX; see previous note

## 2024-08-24 NOTE — Progress Notes (Addendum)
 Provider:  Dedra Gores, MD  Primary Care Physician:  Charlett Apolinar POUR, MD [address] [place] [address]     Referring Provider: Charlett Apolinar POUR, Md [address] [place],  [place] [address]          Chief Complaint according to patient   Patient presents with:                HISTORY OF PRESENT ILLNESS:  Alicia Keith is a 73 y.o. female patient who is here for revisit 08/24/2024 for  non -intractable migraines, chronic migraines with over 15 days of migraine per months, rather 25/ 30/ Drug that worked best was .    Chief concern according to patient :  I may need a new preventive medication, I started to increase Topiramate  to 50 mg bid, Feel a decrease in frequency and intensity since this was titrated up. Patient has returned to  Zoloft  , changed back from Prozac . In regards to her headaches:  Miserable :daily headaches, between 6 and 7/ 10 intensity, helps to lay down and put a ice pack on her forehead  If it reaches 10/ 10 , she places an ice pack in the neck.   Abortive ; triptans.    No other , secondary headache types.    Location: Frontal and occiptal  Frequency: daily for April and May and  October/November ,  Duration: can last hours , if not using a triptan  ( that works within one hour)  Aura: no but may get a flash of light with migraine   Photophonia: yes Phonophobia: yes Olfactophobia : yes Nausea: Yes Vomiting: no Numbness: no  Weakness: no  Visual changes:flashing light during headache on rare occasion  Gait and balance: no, occassional dizziness Memory: no- just foggy feeling while having headache with a migraine- character.     Abortive: Advil/baclofen  first  then will take rizitriptan    Imaging: 06/26/16:  IMPRESSION: No acute intracranial abnormality is identified. Unremarkable MRI of the brain for age.   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 (!)>  Has tried and failed  Emgality, ajovy .   Has not tried Nurtec, which can be preventive and abortive. I start every other day - starting today.   Qulipta for prevention.   Ubrelvy   abortive.     Fam Hx : see previous note  Social HX; see previous note  , married to Dr Graft,  hematologist       02/17/24:   Alicia Keith is a 73 y.o. female with a history of Migraine headache. Returns today for follow-up. Reports more migraines in spring and fall.  Patient was originally placed on Zoloft  by Dr. Gores for anxiety.  She was switched to Prozac .  We recently increased Prozac  she states that she cannot tolerate the side effects which she describes as gritting her teeth, tinnitus and gaining weight.  She would like to switch back to Zoloft .  She states in the past it was thought that she may have a reaction to Zoloft  but it was actually to Boniva.  She does have a PCP that she sees regularly.        Review of Systems: Out of a complete 14 system review, the patient complains of only the following symptoms, and all other reviewed systems are negative.:     Social History   Socioeconomic History   Marital status: Married  Spouse name: Not on file   Number of children: 1   Years of education: 68   Highest education level: Bachelor's degree (e.g., BA, AB, BS)  Occupational History   Occupation: Retired    Comment: PT  Tobacco Use   Smoking status: Never   Smokeless tobacco: Never   Tobacco comments:    married, retired PT  Advertising Account Planner   Vaping status: Not on file  Substance and Sexual Activity   Alcohol use: Yes    Alcohol/week: 2.0 standard drinks of alcohol    Types: 2 Glasses of wine per week    Comment: Couple glasses of wine   Drug use: No   Sexual activity: Not on file  Other Topics Concern   Not on file  Social History Narrative   8 hours of sleep per night   Lives with Dr. 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    Pet cat   Social Drivers of Health    Financial Resource Strain: Low Risk  (03/16/2024)   Overall Financial Resource Strain (CARDIA)    Difficulty of Paying Living Expenses: Not hard at all  Food Insecurity: No Food Insecurity (03/16/2024)   Hunger Vital Sign    Worried About Running Out of Food in the Last Year: Never true    Ran Out of Food in the Last Year: Never true  Transportation Needs: No Transportation Needs (03/16/2024)   PRAPARE - Administrator, Civil Service (Medical): No    Lack of Transportation (Non-Medical): No  Physical Activity: Sufficiently Active (03/16/2024)   Exercise Vital Sign    Days of Exercise per Week: 6 days    Minutes of Exercise per Session: 30 min  Stress: No Stress Concern Present (03/16/2024)   [employer] of Occupational Health - Occupational Stress Questionnaire    Feeling of Stress: Not at all  Social Connections: Moderately Isolated (03/16/2024)   Social Connection and Isolation Panel    Frequency of Communication with Friends and Family: More than three times a week    Frequency of Social Gatherings with Friends and Family: Once a week    Attends Religious Services: Never    Database Administrator or Organizations: No    Attends Engineer, Structural: Not on file    Marital Status: Married    Family History  Problem Relation Age of Onset   Hyperlipidemia Mother    Renal cancer Mother    Migraines Mother    Hypertension Father    Stroke Father    Migraines Maternal Aunt    Migraines Maternal Grandmother     Past Medical History:  Diagnosis Date   Allergic rhinitis, cause unspecified    Headaches, cluster    History of chicken pox    Migraine headache     Past Surgical History:  Procedure Laterality Date   TONSILLECTOMY  1955     Current Outpatient Medications on File Prior to Visit  Medication Sig Dispense Refill   baclofen  (LIORESAL ) 10 MG tablet TAKE 1 TABLET BY MOUTH DAILY AS NEEDED FOR MUSCLE SPASMS. 90 each 1   COVID-19 mRNA bivalent  vaccine, Pfizer, (PFIZER COVID-19 VAC BIVALENT) injection Inject into the muscle. 0.3 mL 0   Estradiol 10 MCG TABS vaginal tablet Place vaginally 3 (three) times a week. Using twice weekly     influenza vaccine adjuvanted (FLUAD  QUADRIVALENT) 0.5 ML injection Inject into the muscle. 0.5 mL 0   rizatriptan  (MAXALT ) 10 MG tablet TAKE 1  TABLET BY MOUTH AS NEEDED FOR MIGRAINE. MAY REPEAT IN 2 HOURS IF NEEDED. MAX 2 TABS IN 24 HOURS 18 tablet 5   rosuvastatin  (CRESTOR ) 5 MG tablet Take 1 tablet (5 mg total) by mouth daily. Patient needs an appointment with PCP for further refill. 30 tablet 0   sertraline  (ZOLOFT ) 25 MG tablet Take 1 tablet (25 mg total) by mouth daily. 30 tablet 11   topiramate  (TOPAMAX ) 50 MG tablet TAKE 1 TABLET(50 MG) BY MOUTH DAILY (Patient taking differently: 100 mg 2 (two) times daily.) 90 tablet 1   No current facility-administered medications on file prior to visit.    Allergies  Allergen Reactions   Amitriptyline      palpitations   Boniva [Ibandronate] Other (See Comments)    Vasculitis    Nortriptyline      Palpitations and racing heart beat   Imipramine Rash     DIAGNOSTIC DATA (LABS, IMAGING, TESTING) - I reviewed patient records, labs, notes, testing and imaging myself where available.  Lab Results  Component Value Date   WBC 6.2 05/21/2023   HGB 12.1 05/21/2023   HCT 37.6 05/21/2023   MCV 86.8 05/21/2023   PLT 163.0 05/21/2023      Component Value Date/Time   NA 136 05/21/2023 1024   NA 142 03/12/2017 1346   K 4.0 05/21/2023 1024   CL 101 05/21/2023 1024   CO2 27 05/21/2023 1024   GLUCOSE 86 05/21/2023 1024   BUN 28 (H) 05/21/2023 1024   BUN 20 03/12/2017 1346   CREATININE 0.93 05/21/2023 1024   CALCIUM  9.3 05/21/2023 1024   PROT 7.2 05/21/2023 1024   PROT 6.7 03/12/2017 1346   ALBUMIN 4.1 05/21/2023 1024   ALBUMIN 4.2 03/12/2017 1346   AST 20 05/21/2023 1024   ALT 17 05/21/2023 1024   ALKPHOS 60 05/21/2023 1024   BILITOT 0.4 05/21/2023  1024   BILITOT <0.2 03/12/2017 1346   GFRNONAA 68 03/12/2017 1346   GFRAA 79 03/12/2017 1346   Lab Results  Component Value Date   CHOL 172 05/21/2023   HDL 74.30 05/21/2023   LDLCALC 80 05/21/2023   TRIG 88.0 05/21/2023   CHOLHDL 2 05/21/2023   Lab Results  Component Value Date   HGBA1C 5.9 05/21/2023   Lab Results  Component Value Date   VITAMINB12 1,088 (H) 01/17/2022   Lab Results  Component Value Date   TSH 1.84 05/21/2023    PHYSICAL EXAM:  Vitals:   08/24/24 1020  BP: 108/82  Pulse: 81   No data found. Body mass index is 24.8 kg/m.   Wt Readings from Last 3 Encounters:  08/24/24 127 lb (57.6 kg)  02/17/24 126 lb (57.2 kg)  05/21/23 120 lb (54.4 kg)     Ht Readings from Last 3 Encounters:  08/24/24 5' (1.524 m)  02/17/24 5' (1.524 m)  05/21/23 5' 0.2 (1.529 m)      General: The patient is awake, alert and appears not in acute distress and groomed. Head: Normocephalic, atraumatic.  Neck is supple. Mallampati 2,  neck circumference:12.5 inches .   Nasal airflow not fully patent.   allergies in Spring and Fall !  Takes long acting   anti-allergy meds,  Not decongestant.  Overbite Dwan is  seen.  Dental status:  Cardiovascular:  Regular rate and cardiac rhythm by pulse, without distended neck veins. Respiratory: no shortness of breath  Skin:  Without evidence of ankle edema, or rash. Trunk: BMI is 24.8     NEUROLOGIC EXAM:  The patient is awake and alert, oriented to place and time.   Memory subjective described as intact.  Attention span & concentration ability appears normal.   Speech is fluent,  without  dysarthria, dysphonia or aphasia.  Mood and affect are appropriate.   Neurological Examination: Mental Status: Intact. Language and speech are normal. No cognitive deficits. No loss of smell.  Cranial Nerves II-XII: Intact.  PERL. EOMI.   No facial droop.  No ptosis.   Hearing is grossly intact bilaterally.  The tongue is  normal and midline. Motor: Strengths are 5/5 throughout.  Muscle bulk and tone are normal. No tremors.  Sensory: Grossly intact throughout to all modalities. Reflexes: Normal and symmetric throughout bilaterally. Gait and Station: Normal.    ASSESSMENT AND PLAN :   73 y.o. year old female  here with:    1) chronic migraines, not intractable, no aura- but almost daily onset and lasting hours of no triptan is taken.   Did develop rash on Imipramine, developed  palpitations on Amytriptyline.  Now on Topiramate  50 mg bid and helping a bit, not enough. Triptan helps to abort headaches, will continue. Adding Nurtec as prevention medication.  Continue to use anti= allergy medication daily at HS for seasonal allergies.   I have provided samples for Nurtec.   Will start 16 a month for every other day prevention.   I ordered a brain MRI - hr last study was from 2017-18, and ordered a HST for cluster headaches as well, rule out hypoxia.     I would like to thank Panosh, Apolinar POUR, Md [address] [place],  [place] [address] for allowing me to meet with this pleasant patient.   Sleep Clinic Patients are generally offered input on sleep hygiene, life style changes and how to improve compliance with medical treatment where applicable. Review and reiteration of good sleep hygiene measures is offered to any sleep clinic patient, be it in the first consultation or with any follow up visits.  Any patient with sleepiness should be cautioned not to drive, work at heights, or operate dangerous or heavy equipment when feeling tired or sleepy.   The patient will be seen in follow-up in the sleep clinic at [facility] for discussion of test results, sleep related symptoms and treatment compliance review, further management strategies, etc.   The referring provider will be notified of the test results.   The patient's condition requires frequent monitoring and adjustments in the treatment plan, reflecting  the ongoing complexity of care.  This provider is the continuing focal point for all needed services for this condition.  After spending a total time of  31  minutes face to face and time for  history taking, physical and neurologic examination, review of laboratory studies,  personal review of imaging studies, reports and results of other testing and review of referral information / records as far as provided in visit,   Electronically signed by: Dedra Gores, MD 08/24/2024 10:45 AM  [facility] Board certified by The [employer] of Sleep Medicine and Diplomate of the [employer] of Sleep Medicine. Board certified In Neurology through the ABPN, Fellow of the [employer] of Neurology.

## 2024-08-26 ENCOUNTER — Ambulatory Visit: Admitting: Internal Medicine

## 2024-08-26 ENCOUNTER — Encounter: Payer: Self-pay | Admitting: Internal Medicine

## 2024-08-26 VITALS — BP 100/60 | HR 74 | Temp 98.1°F | Ht 60.0 in | Wt 126.2 lb

## 2024-08-26 DIAGNOSIS — R519 Headache, unspecified: Secondary | ICD-10-CM

## 2024-08-26 DIAGNOSIS — E785 Hyperlipidemia, unspecified: Secondary | ICD-10-CM

## 2024-08-26 DIAGNOSIS — I700 Atherosclerosis of aorta: Secondary | ICD-10-CM

## 2024-08-26 DIAGNOSIS — I77810 Thoracic aortic ectasia: Secondary | ICD-10-CM

## 2024-08-26 DIAGNOSIS — D696 Thrombocytopenia, unspecified: Secondary | ICD-10-CM

## 2024-08-26 DIAGNOSIS — Z79899 Other long term (current) drug therapy: Secondary | ICD-10-CM

## 2024-08-26 NOTE — Patient Instructions (Addendum)
 Good to see  you today . Make fasting lab appt . 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   Immunization History  Administered Date(s) Administered   Fluad  Quad(high Dose 65+) 06/03/2019   Fluad  Trivalent(High Dose 65+) 06/10/2024   Hep B, Unspecified 03/18/1991, 04/24/1991, 09/24/1991   Hepatitis A 03/07/2006, 10/17/2006   INFLUENZA, HIGH DOSE SEASONAL PF 06/06/2016, 05/28/2018   Influenza-Unspecified 05/19/2015, 05/18/2016, 06/06/2020, 07/05/2021, 06/15/2022   PFIZER Comirnaty(Gray Top)Covid-19 Tri-Sucrose Vaccine 01/31/2021   PFIZER(Purple Top)SARS-COV-2 Vaccination 10/31/2019, 11/22/2019   Pfizer Covid-19 Vaccine Bivalent Booster 52yrs & up 06/30/2021   Pfizer(Comirnaty)Fall Seasonal Vaccine 12 years and older 07/12/2022, 05/21/2023, 06/10/2024   Pneumococcal Conjugate-13 04/24/2016   Pneumococcal Polysaccharide-23 03/26/2018   Td 09/17/2005   Tdap 03/26/2018   Zoster Recombinant(Shingrix ) 03/26/2018, 05/28/2018   Zoster, Live 01/14/2012

## 2024-08-26 NOTE — Progress Notes (Signed)
 Chief Complaint  Patient presents with   Medical Management of Chronic Issues    Pt reports she is here get lab work.     HPI: Alicia Keith 73 y.o. come in for Chronic disease   med management   Due for yearly assessmnt  HA : getting worse so saw neurology and  up topomax 50 bid .   To try nurtec .  But may be too expensive so has not tried yet there is strong family history of migraine headaches.  HLD :  no concern taking medication rosuvastatin  Sleep: 9 interrupted . TAD  : weekends social .  Healthy  eating.  Mediterranean diet .   Exercise :  treadmill and weights No fractures.  Significant injury.   ROS: See pertinent positives and negatives per HPI.  No chest pain shortness of breath at significance exercise intolerance.  Negative GI GU of significance.  No neuropathy symptoms.   Past Medical History:  Diagnosis Date   Allergic rhinitis, cause unspecified    Headaches, cluster    History of chicken pox    Migraine headache     Family History  Problem Relation Age of Onset   Hyperlipidemia Mother    Renal cancer Mother    Migraines Mother    Hypertension Father    Stroke Father    Migraines Maternal Aunt    Migraines Maternal Grandmother     Social History   Socioeconomic History   Marital status: Married    Spouse name: Not on file   Number of children: 1   Years of education: 17   Highest education level: Bachelor's degree (e.g., BA, AB, BS)  Occupational History   Occupation: Retired    Comment: PT  Tobacco Use   Smoking status: Never   Smokeless tobacco: Never   Tobacco comments:    married, retired PT  Advertising Account Planner   Vaping status: Not on file  Substance and Sexual Activity   Alcohol use: Yes    Alcohol/week: 2.0 standard drinks of alcohol    Types: 2 Glasses of wine per week    Comment: Couple glasses of wine   Drug use: No   Sexual activity: Not on file  Other Topics Concern   Not on file  Social History Narrative   8 hours  of sleep per night   Lives with Dr. 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    Pet cat   Social Drivers of Health   Financial Resource Strain: Low Risk  (08/24/2024)   Overall Financial Resource Strain (CARDIA)    Difficulty of Paying Living Expenses: Not hard at all  Food Insecurity: No Food Insecurity (08/24/2024)   Hunger Vital Sign    Worried About Running Out of Food in the Last Year: Never true    Ran Out of Food in the Last Year: Never true  Transportation Needs: No Transportation Needs (08/24/2024)   PRAPARE - Administrator, Civil Service (Medical): No    Lack of Transportation (Non-Medical): No  Physical Activity: Sufficiently Active (08/24/2024)   Exercise Vital Sign    Days of Exercise per Week: 6 days    Minutes of Exercise per Session: 30 min  Stress: No Stress Concern Present (08/24/2024)   [employer] of Occupational Health - Occupational Stress Questionnaire    Feeling of Stress: Not at all  Social Connections: Moderately Isolated (08/24/2024)   Social Connection and Isolation Panel    Frequency of Communication  with Friends and Family: Twice a week    Frequency of Social Gatherings with Friends and Family: Once a week    Attends Religious Services: Never    Database Administrator or Organizations: No    Attends Engineer, Structural: Not on file    Marital Status: Married    Outpatient Medications Prior to Visit  Medication Sig Dispense Refill   baclofen  (LIORESAL ) 10 MG tablet TAKE 1 TABLET BY MOUTH DAILY AS NEEDED FOR MUSCLE SPASMS. 90 each 1   Estradiol 10 MCG TABS vaginal tablet Place vaginally 3 (three) times a week. Using twice weekly     rizatriptan  (MAXALT ) 10 MG tablet TAKE 1 TABLET BY MOUTH AS NEEDED FOR MIGRAINE. MAY REPEAT IN 2 HOURS IF NEEDED. MAX 2 TABS IN 24 HOURS 16 tablet 5   rosuvastatin  (CRESTOR ) 5 MG tablet Take 1 tablet (5 mg total) by mouth daily. Patient needs an appointment with PCP for further refill. 30  tablet 0   sertraline  (ZOLOFT ) 25 MG tablet Take 1 tablet (25 mg total) by mouth daily. 30 tablet 11   topiramate  (TOPAMAX ) 50 MG tablet Take 1 tablet (50 mg total) by mouth 2 (two) times daily. 180 tablet 1   COVID-19 mRNA bivalent vaccine, Pfizer, (PFIZER COVID-19 VAC BIVALENT) injection Inject into the muscle. 0.3 mL 0   influenza vaccine adjuvanted (FLUAD  QUADRIVALENT) 0.5 ML injection Inject into the muscle. 0.5 mL 0   NURTEC 75 MG TBDP Take one every other day for migraine prevention. (Patient not taking: Reported on 08/26/2024) 16 tablet 5   No facility-administered medications prior to visit.     EXAM:  BP 100/60 (BP Location: Left Arm, Patient Position: Sitting, Cuff Size: Normal)   Pulse 74   Temp 98.1 F (36.7 C) (Oral)   Ht 5' (1.524 m)   Wt 126 lb 3.2 oz (57.2 kg)   SpO2 98%   BMI 24.65 kg/m   Body mass index is 24.65 kg/m.  GENERAL: vitals reviewed and listed above, alert, oriented, appears well hydrated and in no acute distress HEENT: atraumatic, conjunctiva  clear, no obvious abnormalities on inspection of external nose and ears TMs clear normal landmarks OP : no lesion edema or exudate  NECK: no obvious masses on inspection palpation  LUNGS: clear to auscultation bilaterally, no wheezes, rales or rhonchi, good air movement CV: HRRR, no clubbing cyanosis or  peripheral edema nl cap refill  Abdomen:  Sof,t normal bowel sounds without hepatosplenomegaly, no guarding rebound or masses no CVA tenderness MS: moves all extremities without noticeable focal  abnormality PSYCH: pleasant and cooperative, no obvious depression or anxiety Lab Results  Component Value Date   WBC 6.2 05/21/2023   HGB 12.1 05/21/2023   HCT 37.6 05/21/2023   PLT 163.0 05/21/2023   GLUCOSE 86 05/21/2023   CHOL 172 05/21/2023   TRIG 88.0 05/21/2023   HDL 74.30 05/21/2023   LDLCALC 80 05/21/2023   ALT 17 05/21/2023   AST 20 05/21/2023   NA 136 05/21/2023   K 4.0 05/21/2023   CL 101  05/21/2023   CREATININE 0.93 05/21/2023   BUN 28 (H) 05/21/2023   CO2 27 05/21/2023   TSH 1.84 05/21/2023   INR 1.0 05/29/2019   HGBA1C 5.9 05/21/2023   BP Readings from Last 3 Encounters:  08/26/24 100/60  08/24/24 108/82  02/17/24 102/63   Hard boiled eggs and water at lunch   Lab Results  Component Value Date   FERRITIN 39.3 05/21/2023  ASSESSMENT AND PLAN:  Discussed the following assessment and plan:  Ascending aorta dilatation - Plan: CBC with Differential/Platelet, Comprehensive metabolic panel with GFR, Lipid panel, TSH, Hemoglobin A1c, Ambulatory referral to Cardiology  Hyperlipidemia, unspecified hyperlipidemia type - Plan: CBC with Differential/Platelet, Comprehensive metabolic panel with GFR, Lipid panel, TSH, Hemoglobin A1c, Ambulatory referral to Cardiology  Medication management - Plan: CBC with Differential/Platelet, Comprehensive metabolic panel with GFR, Lipid panel, TSH, Hemoglobin A1c  Atherosclerosis of aorta - seen on imaging - Plan: Ambulatory referral to Cardiology  Thrombocytopenia - no bleeding hx of same - Plan: CBC with Differential/Platelet, Comprehensive metabolic panel with GFR, Lipid panel, TSH, Hemoglobin A1c  Recurrent headache - under neuro care Exam is reassuring. Update lipid panel  lab monitoring .to come back fasting per patient preference)  Disc  incidental finding of borderline aortic dilatation 2 years ago and   fu monitoring  .  Consider update imaginig oand or cards consult  Will do cards referral . /consult  Recurrent headaches  and fam hx per neurology .  Considier rsv vaccine  -Patient advised to return or notify health care team  if  new concerns arise.  Patient Instructions  Good to see  you today . Make fasting lab appt . 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   Immunization History  Administered Date(s) Administered   Fluad  Quad(high Dose 65+) 06/03/2019   Fluad   Trivalent(High Dose 65+) 06/10/2024   Hep B, Unspecified 03/18/1991, 04/24/1991, 09/24/1991   Hepatitis A 03/07/2006, 10/17/2006   INFLUENZA, HIGH DOSE SEASONAL PF 06/06/2016, 05/28/2018   Influenza-Unspecified 05/19/2015, 05/18/2016, 06/06/2020, 07/05/2021, 06/15/2022   PFIZER Comirnaty(Gray Top)Covid-19 Tri-Sucrose Vaccine 01/31/2021   PFIZER(Purple Top)SARS-COV-2 Vaccination 10/31/2019, 11/22/2019   Pfizer Covid-19 Vaccine Bivalent Booster 52yrs & up 06/30/2021   Pfizer(Comirnaty)Fall Seasonal Vaccine 12 years and older 07/12/2022, 05/21/2023, 06/10/2024   Pneumococcal Conjugate-13 04/24/2016   Pneumococcal Polysaccharide-23 03/26/2018   Td 09/17/2005   Tdap 03/26/2018   Zoster Recombinant(Shingrix ) 03/26/2018, 05/28/2018   Zoster, Live 01/14/2012      Apolinar POUR. Nivek Powley M.D.

## 2024-08-27 ENCOUNTER — Telehealth: Payer: Self-pay | Admitting: Pharmacist

## 2024-08-27 NOTE — Telephone Encounter (Signed)
 Pharmacy Patient Advocate Encounter   Received notification from Patient Pharmacy that prior authorization for Nurtec 75MG  dispersible tablets is required/requested.   Insurance verification completed.   The patient is insured through [facility].   Per test claim: PA required; PA submitted to above mentioned insurance via Latent Key/confirmation #/EOC [id number] Status is pending

## 2024-08-27 NOTE — Telephone Encounter (Signed)
 Pharmacy Patient Advocate Encounter  Received notification from [employer] that Prior Authorization for NURTEC 75 MG TBDP has been APPROVED from 08/27/2024 to 09/16/2098   PA #/Case ID/Reference #: [id number]

## 2024-08-28 ENCOUNTER — Other Ambulatory Visit (INDEPENDENT_AMBULATORY_CARE_PROVIDER_SITE_OTHER)

## 2024-08-28 DIAGNOSIS — I77810 Thoracic aortic ectasia: Secondary | ICD-10-CM

## 2024-08-28 DIAGNOSIS — D696 Thrombocytopenia, unspecified: Secondary | ICD-10-CM | POA: Diagnosis not present

## 2024-08-28 DIAGNOSIS — Z79899 Other long term (current) drug therapy: Secondary | ICD-10-CM

## 2024-08-28 DIAGNOSIS — E785 Hyperlipidemia, unspecified: Secondary | ICD-10-CM | POA: Diagnosis not present

## 2024-08-28 LAB — HEMOGLOBIN A1C: Hgb A1c MFr Bld: 5.7 % (ref 4.6–6.5)

## 2024-08-28 LAB — CBC WITH DIFFERENTIAL/PLATELET
Basophils Absolute: 0.1 K/uL (ref 0.0–0.1)
Basophils Relative: 2.3 % (ref 0.0–3.0)
Eosinophils Absolute: 0.2 K/uL (ref 0.0–0.7)
Eosinophils Relative: 4 % (ref 0.0–5.0)
HCT: 38.1 % (ref 36.0–46.0)
Hemoglobin: 12.5 g/dL (ref 12.0–15.0)
Lymphocytes Relative: 24 % (ref 12.0–46.0)
Lymphs Abs: 1.4 K/uL (ref 0.7–4.0)
MCHC: 32.8 g/dL (ref 30.0–36.0)
MCV: 85.9 fl (ref 78.0–100.0)
Monocytes Absolute: 0.5 K/uL (ref 0.1–1.0)
Monocytes Relative: 8 % (ref 3.0–12.0)
Neutro Abs: 3.6 K/uL (ref 1.4–7.7)
Neutrophils Relative %: 61.7 % (ref 43.0–77.0)
Platelets: 176 K/uL (ref 150.0–400.0)
RBC: 4.43 Mil/uL (ref 3.87–5.11)
RDW: 14.4 % (ref 11.5–15.5)
WBC: 5.9 K/uL (ref 4.0–10.5)

## 2024-08-28 LAB — COMPREHENSIVE METABOLIC PANEL WITH GFR
ALT: 14 U/L (ref 0–35)
AST: 17 U/L (ref 0–37)
Albumin: 4.3 g/dL (ref 3.5–5.2)
Alkaline Phosphatase: 61 U/L (ref 39–117)
BUN: 32 mg/dL — ABNORMAL HIGH (ref 6–23)
CO2: 28 meq/L (ref 19–32)
Calcium: 9.5 mg/dL (ref 8.4–10.5)
Chloride: 105 meq/L (ref 96–112)
Creatinine, Ser: 0.95 mg/dL (ref 0.40–1.20)
GFR: 58.7 mL/min — ABNORMAL LOW (ref >60.00)
Glucose, Bld: 95 mg/dL (ref 70–99)
Potassium: 4.2 meq/L (ref 3.5–5.1)
Sodium: 140 meq/L (ref 135–145)
Total Bilirubin: 0.3 mg/dL (ref 0.2–1.2)
Total Protein: 7.2 g/dL (ref 6.0–8.3)

## 2024-08-28 LAB — LIPID PANEL
Cholesterol: 178 mg/dL (ref 0–200)
HDL: 75.5 mg/dL (ref >39.00)
LDL Cholesterol: 86 mg/dL (ref 0–99)
NonHDL: 102.71
Total CHOL/HDL Ratio: 2
Triglycerides: 82 mg/dL (ref 0.0–149.0)
VLDL: 16.4 mg/dL (ref 0.0–40.0)

## 2024-08-28 LAB — TSH: TSH: 3.27 u[IU]/mL (ref 0.35–5.50)

## 2024-09-01 ENCOUNTER — Ambulatory Visit: Payer: Self-pay | Admitting: Internal Medicine

## 2024-09-01 NOTE — Progress Notes (Signed)
 Thyroid  , blood count  stable , or in range results   no diabetes  Gfr is  estimated as slightly low  this  may be  not significant  but  Should follow up  Please arrange hydrated  bmp with gfr , cystatin c and urine microalbumin ratio  Diagnosis : dec gfr.

## 2024-09-03 ENCOUNTER — Inpatient Hospital Stay: Admission: RE | Admit: 2024-09-03 | Discharge: 2024-09-03 | Attending: Neurology | Admitting: Neurology

## 2024-09-03 ENCOUNTER — Ambulatory Visit: Payer: Self-pay | Admitting: Neurology

## 2024-09-03 DIAGNOSIS — I77810 Thoracic aortic ectasia: Secondary | ICD-10-CM

## 2024-09-03 DIAGNOSIS — G44019 Episodic cluster headache, not intractable: Secondary | ICD-10-CM

## 2024-09-03 DIAGNOSIS — G43011 Migraine without aura, intractable, with status migrainosus: Secondary | ICD-10-CM | POA: Diagnosis not present

## 2024-09-03 DIAGNOSIS — G43001 Migraine without aura, not intractable, with status migrainosus: Secondary | ICD-10-CM

## 2024-09-03 DIAGNOSIS — D696 Thrombocytopenia, unspecified: Secondary | ICD-10-CM

## 2024-09-03 NOTE — Telephone Encounter (Signed)
-----   Message from Dedra Gores, MD sent at 09/03/2024  1:47 PM EST ----- No advanced microvascular disease, no migraine sequelae, normal MRI brain.

## 2024-09-03 NOTE — Telephone Encounter (Signed)
 Spoke to patient gave MRI Brain results Pt expressed understanding and thanked me for calling

## 2024-09-16 ENCOUNTER — Other Ambulatory Visit: Payer: Self-pay | Admitting: Internal Medicine

## 2024-09-16 ENCOUNTER — Ambulatory Visit: Admitting: Neurology

## 2024-09-16 DIAGNOSIS — D696 Thrombocytopenia, unspecified: Secondary | ICD-10-CM

## 2024-09-16 DIAGNOSIS — I77810 Thoracic aortic ectasia: Secondary | ICD-10-CM

## 2024-09-16 DIAGNOSIS — G44019 Episodic cluster headache, not intractable: Secondary | ICD-10-CM

## 2024-09-16 DIAGNOSIS — G4733 Obstructive sleep apnea (adult) (pediatric): Secondary | ICD-10-CM | POA: Diagnosis not present

## 2024-09-16 DIAGNOSIS — G43011 Migraine without aura, intractable, with status migrainosus: Secondary | ICD-10-CM

## 2024-09-16 DIAGNOSIS — G43001 Migraine without aura, not intractable, with status migrainosus: Secondary | ICD-10-CM

## 2024-09-23 ENCOUNTER — Other Ambulatory Visit (INDEPENDENT_AMBULATORY_CARE_PROVIDER_SITE_OTHER)

## 2024-09-23 DIAGNOSIS — R944 Abnormal results of kidney function studies: Secondary | ICD-10-CM

## 2024-09-23 LAB — BASIC METABOLIC PANEL WITH GFR
BUN: 29 mg/dL — ABNORMAL HIGH (ref 6–23)
CO2: 30 meq/L (ref 19–32)
Calcium: 9.3 mg/dL (ref 8.4–10.5)
Chloride: 101 meq/L (ref 96–112)
Creatinine, Ser: 0.91 mg/dL (ref 0.40–1.20)
GFR: 61.78 mL/min
Glucose, Bld: 95 mg/dL (ref 70–99)
Potassium: 4.1 meq/L (ref 3.5–5.1)
Sodium: 136 meq/L (ref 135–145)

## 2024-09-23 LAB — MICROALBUMIN / CREATININE URINE RATIO
Creatinine,U: 12.3 mg/dL
Microalb Creat Ratio: UNDETERMINED mg/g (ref 0.0–30.0)
Microalb, Ur: <0.7 mg/dL

## 2024-09-24 LAB — CYSTATIN C: CYSTATIN C: 1.16 mg/L — ABNORMAL HIGH (ref 0.78–1.15)

## 2024-09-24 NOTE — Progress Notes (Unsigned)
 Alicia Keith

## 2024-09-28 ENCOUNTER — Ambulatory Visit: Payer: Self-pay | Admitting: Internal Medicine

## 2024-09-28 DIAGNOSIS — R944 Abnormal results of kidney function studies: Secondary | ICD-10-CM

## 2024-09-28 NOTE — Progress Notes (Signed)
 So gfr back down a bit  but no  increase protein in urine .or evidence of active renal disease.  Make sure  not taking nsaid regularly . And  we should follow  Check bmp with egfr in  4-6 months

## 2024-09-30 NOTE — Procedures (Signed)
 "      [facility]   HOME SLEEP TEST REPORT ( by Elene  mail -out device )    Alicia Keith 74 year old female 06/14/1951  Study Protocol:     The SANSA chest-worn sensor measures eight physiological channels,  including blood oxygen saturation (measured via PPG [photoplethysmography]), EKG-derived heart rate, respiratory effort, chest movement (measured via accelerometer), snoring, body position, and actigraphy.   STUDY DATE:  09-16-2024 Data received :  09-24-2024  ORDERING CLINICIAN:  Duwaine Russell, NP  REFERRING CLINICIAN:  Dedra Gores, MD Apolinar Eastern, MD    CLINICAL INFORMATION/HISTORY: Alicia Keith is a 74 y.o. female patient who is here for revisit 08/24/2024 for  non -intractable migraines, chronic migraines with over 15 days of migraine per months, rather 25/ 30/ Drug that worked best was .     Chief concern according to patient :  I may need a new preventive medication, I started to increase Topiramate  to 50 mg bid, Feel a decrease in frequency and intensity since this was titrated up. Patient has returned to  Zoloft  , changed back from Prozac . In regards to her headaches:   Miserable :daily headaches, between 6 and 7/ 10 intensity, helps to lay down and put a ice pack on her forehead  If it reaches 10/ 10 , she places an ice pack in the neck. chronic migraines, not intractable, no aura- but almost daily onset and lasting hours of no triptan is taken.    Did develop rash on Imipramine, developed  palpitations on Amytriptyline.  Now on Topiramate  50 mg bid and helping a bit, not enough. Triptan helps to abort headaches, will continue. Adding Nurtec as prevention medication.  Continue to use anti= allergy medication daily at HS for seasonal allergies.    I have provided samples for Nurtec.   Will start 16 a month for every other day prevention.    I ordered a brain MRI - hr last study was from 2017-18, and ordered a HST for cluster headaches  as well, rule out hypoxia.     Abortive: Advil/baclofen  first  then will take rizitriptan   No other secondary-type  non-migrainous  headache types.  Location: Frontal and occiptal  Frequency: daily for April and May and  October/November ,   Duration: can last hours , if not using a triptan  ( that works within one hour)  Aura: no but may get a flash of light with migraine   Photophonia: yes Phonophobia: yes Olfactophobia : yes Nausea: Yes Visual changes:flashing light during headache on rare occasion  Gait and balance: no, occassional dizziness Memory: no- just foggy feeling while having headache with a migraine- character.         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 (!)> Has tried and failed Emgality, Ajovy .   Has not tried Nurtec, which can be preventive and abortive. I start every other day - starting today.  Had been on Qulipta for prevention.  Ubrelvy  for abortive.   Epworth sleepiness score:  X /24. FFS at   X/ 63 points   BMI:  24.8 kg/m  Neck Circumference: 12.5     Sleep Summary:   Total Recording Time (hours, min):  09-18-2024, 22:11 hours    Total Sleep Time (hours, min):  6 hours 39 minutes   Sleep efficiency %;77%  REM sleep captured:   99 minutes  Respiratory Indices ( AHI )   by CMS criteria of scoring;    Calculated pAHI (per hour):   4.7/h                                               Positional  respiratory activity  / snoring :  intermittent snoring, bursts in supine sleep of moderate volume.  Oxygen Saturation during Sleep:   Oxygen Saturation (%) Mean at 97%  with an  O2 Saturation Range (%) between 83 % and 99.9 %                                        O2 Saturation time (minutes) <89%:  0  minutes           Pulse Rate during Sleep :   Pulse Mean (bpm) was 70 bpm  in regular rhythm. Pulse Range between 63  bpm and  85  bpm.,                 IMPRESSION:  This HST did not detect a level of apneas and  hypopneas that would qualify for the diagnosis of sleep apnea. No hypoxia and no cardiac rhythm or rate abnormalities of clinical significance were noted.     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  Either way, none of the scoring rules outcome would have supported a correlation between sleep disorders and headache syndrome.     RECOMMENDATION: No therapy recommendations to follow.   Any Patient endorsing a high level of sleepiness should be cautioned not to drive, work at heights, or operate dangerous machinery or heavy equipment when tired or sleepy.  Review of good sleep hygiene measures took place in the initial consultation but should be revisited ( Your guide to better sleep  a publication by the NIH is a good source of information).   The referring provider will be notified of the test results.    I certify that I have reviewed the raw data recording prior to the issuance of this report in accordance with the standards of the American Academy of Sleep Medicine (AASM).    INTERPRETING PHYSICIAN:    Dedra Gores, MD  [facility] Board certified by The [employer] of Sleep Medicine and Diplomate of the [employer] of Sleep Medicine. Board certified In Neurology through the ABPN, Fellow of the [employer] of Neu[employer].         "

## 2024-10-04 NOTE — Progress Notes (Unsigned)
 "   Cardiology Office Note   Date:  10/06/2024   ID:  Alicia, Keith 1950-10-19, MRN [id number]  PCP:  Alicia Apolinar POUR, MD  Cardiologist:   Vina Gull, MD   Pt presents as referral for coronary calcifications and dilated aorta    History of Present Illness: Alicia Keith is a 74 y.o. female with a history of HL,migraine HA Followed by LELON Alicia.  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 Feb 2023   Echo   LVEF normal  AV trileaflet   Mild aortic dilation FEb 2024  CT chest  Ascending aorta 4  cm    The pt is fairly active  Denies CP other than reflux  Also with some back pain  Breathing is good  No palpitations   No dizziness   Active Medications[1]   Allergies:   Amitriptyline , Boniva [ibandronate], Nortriptyline , and Imipramine   Past Medical History:  Diagnosis Date   Allergic rhinitis, cause unspecified    Headaches, cluster    History of chicken pox    Migraine headache     Past Surgical History:  Procedure Laterality Date   TONSILLECTOMY  79     Family History:  The patient's family history includes Hyperlipidemia in her mother; Hypertension in her father; Migraines in her maternal aunt, maternal grandmother, and mother; Renal cancer in her mother; Stroke in her father.    ROS:  Please see the history of present illness. All other systems are reviewed and  Negative to the above problem except as noted.    PHYSICAL EXAM: VS:  BP 108/80 (BP Location: Left Arm, Patient Position: Sitting, Cuff Size: Normal)   Pulse 77   Ht 5' 0.05 (1.525 m)   Wt 126 lb 6.4 oz (57.3 kg)   SpO2 96%   BMI 24.64 kg/m   GEN: Well nourished, well developed, in no acute distress  HEENT: normal  Neck: no JVD, no bruits  Cardiac: RRR; no murmur Respiratory:  clear to auscultation GI: soft, nontender, no masses   No  hepatomegaly  MS: no deformity Moving all extremities   Ext  No LE edema   2+DP pulses    EKG:  EKG is ordered today.  NSR  Occasional PVC    CT chest Feb 2024  1. 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 Overall the extent of disease is very similar to the prior study from 10/23/2021. 2. Aortic atherosclerosis, in addition to left anterior descending coronary artery disease. Assessment for potential risk factor modification, dietary therapy or pharmacologic therapy may be warranted, if clinically indicated. 3. Ectasia of ascending thoracic aorta (4.0 cm in diameter), similar to the prior study. Recommend annual imaging followup by CTA or MRA. This recommendation follows 2010  Echo 2023  1. Left ventricular ejection fraction, by estimation, is 55 to 60%. The  left ventricle has normal function. The left ventricle has no regional  wall motion abnormalities. Left ventricular diastolic parameters are  consistent with Grade I diastolic  dysfunction (impaired relaxation).   2. Right ventricular systolic function is normal. The right ventricular  size is normal. There is normal pulmonary artery systolic pressure. The  estimated right ventricular systolic pressure is 18.2 mmHg.  3. The mitral valve is abnormal. Mild mitral valve regurgitation.   4. The aortic valve is tricuspid. Aortic valve regurgitation is not  visualized.   5. Aortic dilatation noted. There is borderline dilatation of the  ascending aorta, measuring 38 mm.   6. The inferior vena cava is normal in size with greater than 50%  respiratory variability, suggesting right atrial pressure of 3 mmHg.   Comparison(s): No prior Echocardiogram.   Feb 2023  CT     1. Widespread patchy tree-in-bud opacities and nodularity throughout the visualized lungs bilaterally, most prominent in the right middle lobe  and lingula, with associated mild cylindrical bronchiectasis. Subpleural triangular foci of consolidation in the peripheral right middle lobe and lingula at the areas of bronchiectasis. 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 Suggest pulmonology consultation and further evaluation with high-resolution chest CT. 2. Dilated 4.1 cm ascending thoracic aorta. Recommend annual imaging followup by CTA or MRA. This recommendation follows 2010 ACCF/AHA/AATS/ACR/ASA/SCA/SCAI/SIR/STS/SVM Guidelines for the Diagnosis and Management of Patients with Thoracic Aortic Disease. Circulation. 2010; 121: Z733-z630. Aortic aneurysm NOS (ICD10-I71.9). 3. Aortic Atherosclerosis (ICD10-I70.0).  Coronary calcium  score of 10.4. This was 42nd percentile for age-, race-, and sex-matched controls.   Mildly dilated ascending aorta at 39mm. Recommend dedicated gate Chest CTA for further evaluation     Lipid Panel    Component Value Date/Time   CHOL 178 08/28/2024 0824   TRIG 82.0 08/28/2024 0824   HDL 75.50 08/28/2024 0824   CHOLHDL 2 08/28/2024 0824   VLDL 16.4 08/28/2024 0824   LDLCALC 86 08/28/2024 0824      Wt Readings from Last 3 Encounters:  10/06/24 126 lb 6.4 oz (57.3 kg)  08/26/24 126 lb 3.2 oz (57.2 kg)  08/24/24 127 lb (57.6 kg)      ASSESSMENT AND PLAN:  1 Dilated aorta  pt with mild dilation of asc aorta  Last scan (not a CTA) was in 2024    Will review images   2  Atherosclerosis   Pt with calcifications of aorta as well as coronary arteries     Rx risk factors   3  Lipids   Pt started on Crestor  a couple years ago.  Dec 2025:   LDL 86 HDL 76  Trig 82    Lpa in past  low at 36    Improved form 150 several years ago    Has been better   Discussed diet  Keep on statin  WOrk on diet    Next labs should be an NMR panel or at least ApOB    Current medicines are reviewed at length with the patient today.  The  patient does not have concerns regarding medicines.  Signed, Vina Gull, MD        [1]  Current Meds  Medication Sig   baclofen  (LIORESAL ) 10 MG tablet TAKE 1 TABLET BY MOUTH DAILY AS NEEDED FOR MUSCLE SPASMS. (Patient taking differently: as needed. TAKE 1 TABLET BY MOUTH DAILY AS NEEDED FOR MUSCLE SPASMS.)   COVID-19 mRNA bivalent vaccine, Pfizer, (PFIZER COVID-19 VAC BIVALENT) injection Inject into the muscle.   Estradiol 10 MCG TABS vaginal tablet Place vaginally 3 (three) times a week. Using twice weekly   influenza vaccine adjuvanted (FLUAD  QUADRIVALENT) 0.5 ML injection Inject into the muscle.   rizatriptan  (MAXALT ) 10 MG tablet TAKE 1 TABLET BY MOUTH AS NEEDED FOR MIGRAINE. MAY REPEAT IN 2 HOURS IF NEEDED. MAX 2 TABS IN 24 HOURS (  Patient taking differently: as needed. TAKE 1 TABLET BY MOUTH AS NEEDED FOR MIGRAINE. MAY REPEAT IN 2 HOURS IF NEEDED. MAX 2 TABS IN 24 HOURS)   rosuvastatin  (CRESTOR ) 5 MG tablet TAKE 1 TABLET(5 MG) BY MOUTH DAILY   sertraline  (ZOLOFT ) 25 MG tablet Take 1 tablet (25 mg total) by mouth daily.   topiramate  (TOPAMAX ) 50 MG tablet Take 1 tablet (50 mg total) by mouth 2 (two) times daily.   "

## 2024-10-06 ENCOUNTER — Ambulatory Visit: Admitting: Internal Medicine

## 2024-10-06 ENCOUNTER — Encounter: Payer: Self-pay | Admitting: Internal Medicine

## 2024-10-06 VITALS — BP 108/80 | HR 77 | Ht 60.05 in | Wt 126.4 lb

## 2024-10-06 DIAGNOSIS — I700 Atherosclerosis of aorta: Secondary | ICD-10-CM | POA: Diagnosis not present

## 2024-10-06 NOTE — Patient Instructions (Signed)
 Medication Instructions:  Your physician recommends that you continue on your current medications as directed. Please refer to the Current Medication list given to you today.  *If you need a refill on your cardiac medications before your next appointment, please call your pharmacy*  Lab Work: NONE    Testing/Procedures: NONE   Follow-Up: to be determined  At [facility], you and your health needs are our priority.  As part of our continuing mission to provide you with exceptional heart care, our providers are all part of one team.  This team includes your primary Cardiologist (physician) and Advanced Practice Providers or APPs (Physician Assistants and Nurse Practitioners) who all work together to provide you with the care you need, when you need it.    Provider:   Vina Gull, MD      Other Instructions

## 2024-10-07 ENCOUNTER — Telehealth: Payer: Self-pay | Admitting: Internal Medicine

## 2024-10-07 DIAGNOSIS — I77810 Thoracic aortic ectasia: Secondary | ICD-10-CM

## 2024-10-07 NOTE — Addendum Note (Signed)
 Addended by: BETHENA POWELL SAUNDERS on: 10/07/2024 03:24 PM   Modules accepted: Orders

## 2024-10-07 NOTE — Telephone Encounter (Signed)
 Left VM for patient that someone would call her about test Please order at CTA of  thoracic aorta    Hx dilated aorta   Need to follow up

## 2024-10-08 ENCOUNTER — Ambulatory Visit (HOSPITAL_COMMUNITY)
Admission: RE | Admit: 2024-10-08 | Discharge: 2024-10-08 | Disposition: A | Discharge status: Home / Self Care | Source: Ambulatory Visit | Attending: Internal Medicine | Admitting: Internal Medicine

## 2024-10-08 DIAGNOSIS — I77810 Thoracic aortic ectasia: Secondary | ICD-10-CM | POA: Diagnosis present

## 2024-10-08 MED ORDER — IOHEXOL 350 MG/ML SOLN
75.0000 mL | Freq: Once | INTRAVENOUS | Status: AC | PRN
  Administered 2024-10-08: 75 mL via INTRAVENOUS

## 2024-10-09 LAB — POCT I-STAT CREATININE: Creatinine, Ser: 1 mg/dL (ref 0.44–1.00)

## 2024-10-12 ENCOUNTER — Ambulatory Visit: Payer: Self-pay | Admitting: Internal Medicine

## 2024-10-22 LAB — HM MAMMOGRAPHY

## 2024-10-23 ENCOUNTER — Encounter: Payer: Self-pay | Admitting: Internal Medicine

## 2025-02-15 ENCOUNTER — Other Ambulatory Visit
